# Patient Record
Sex: Male | Born: 1935 | Race: White | Hispanic: No | Marital: Married | Smoking: Former smoker
Health system: Southern US, Community
[De-identification: ages and names within clinical notes are randomized; demographics above are authoritative.]

## PROBLEM LIST (undated history)

## (undated) DIAGNOSIS — I350 Nonrheumatic aortic (valve) stenosis: Secondary | ICD-10-CM

## (undated) DIAGNOSIS — Z87442 Personal history of urinary calculi: Secondary | ICD-10-CM

## (undated) DIAGNOSIS — R319 Hematuria, unspecified: Secondary | ICD-10-CM

## (undated) DIAGNOSIS — I10 Essential (primary) hypertension: Secondary | ICD-10-CM

## (undated) DIAGNOSIS — E785 Hyperlipidemia, unspecified: Secondary | ICD-10-CM

## (undated) DIAGNOSIS — T7840XA Allergy, unspecified, initial encounter: Secondary | ICD-10-CM

## (undated) DIAGNOSIS — R0602 Shortness of breath: Secondary | ICD-10-CM

## (undated) DIAGNOSIS — I480 Paroxysmal atrial fibrillation: Secondary | ICD-10-CM

## (undated) HISTORY — DX: Nonrheumatic aortic (valve) stenosis: I35.0

## (undated) HISTORY — DX: Paroxysmal atrial fibrillation: I48.0

## (undated) HISTORY — DX: Hyperlipidemia, unspecified: E78.5

## (undated) HISTORY — DX: Allergy, unspecified, initial encounter: T78.40XA

## (undated) HISTORY — DX: Shortness of breath: R06.02

## (undated) HISTORY — DX: Essential (primary) hypertension: I10

## (undated) HISTORY — PX: CATARACT EXTRACTION, BILATERAL: SHX1313

## (undated) HISTORY — PX: TONSILLECTOMY: SUR1361

---

## 1998-06-25 ENCOUNTER — Encounter: Payer: Self-pay | Admitting: Cardiology

## 1998-06-25 ENCOUNTER — Ambulatory Visit (HOSPITAL_COMMUNITY): Admission: RE | Admit: 1998-06-25 | Discharge: 1998-06-25 | Payer: Self-pay | Admitting: Cardiology

## 1998-07-31 ENCOUNTER — Ambulatory Visit (HOSPITAL_COMMUNITY): Admission: RE | Admit: 1998-07-31 | Discharge: 1998-07-31 | Payer: Self-pay | Admitting: *Deleted

## 1998-07-31 ENCOUNTER — Encounter: Payer: Self-pay | Admitting: *Deleted

## 1999-04-23 ENCOUNTER — Ambulatory Visit (HOSPITAL_COMMUNITY): Admission: RE | Admit: 1999-04-23 | Discharge: 1999-04-23 | Payer: Self-pay | Admitting: Cardiology

## 1999-04-23 ENCOUNTER — Encounter: Payer: Self-pay | Admitting: Cardiology

## 2001-08-31 ENCOUNTER — Encounter: Admission: RE | Admit: 2001-08-31 | Discharge: 2001-08-31 | Payer: Self-pay | Admitting: Cardiology

## 2001-08-31 ENCOUNTER — Encounter: Payer: Self-pay | Admitting: Cardiology

## 2002-03-30 ENCOUNTER — Encounter: Admission: RE | Admit: 2002-03-30 | Discharge: 2002-03-30 | Payer: Self-pay | Admitting: Internal Medicine

## 2003-08-12 ENCOUNTER — Ambulatory Visit (HOSPITAL_COMMUNITY): Admission: RE | Admit: 2003-08-12 | Discharge: 2003-08-12 | Payer: Self-pay | Admitting: *Deleted

## 2007-11-03 ENCOUNTER — Encounter: Admission: RE | Admit: 2007-11-03 | Discharge: 2007-11-03 | Payer: Self-pay | Admitting: Cardiology

## 2008-03-25 ENCOUNTER — Ambulatory Visit (HOSPITAL_COMMUNITY): Admission: RE | Admit: 2008-03-25 | Discharge: 2008-03-25 | Payer: Self-pay | Admitting: Orthopedic Surgery

## 2008-03-25 ENCOUNTER — Ambulatory Visit: Payer: Self-pay | Admitting: Surgery

## 2008-03-25 ENCOUNTER — Encounter (INDEPENDENT_AMBULATORY_CARE_PROVIDER_SITE_OTHER): Payer: Self-pay | Admitting: Orthopedic Surgery

## 2008-08-30 ENCOUNTER — Encounter: Admission: RE | Admit: 2008-08-30 | Discharge: 2008-08-30 | Payer: Self-pay | Admitting: Orthopedic Surgery

## 2009-02-07 ENCOUNTER — Encounter: Admission: RE | Admit: 2009-02-07 | Discharge: 2009-02-07 | Payer: Self-pay

## 2009-02-07 ENCOUNTER — Inpatient Hospital Stay (HOSPITAL_COMMUNITY): Admission: EM | Admit: 2009-02-07 | Discharge: 2009-02-11 | Payer: Self-pay | Admitting: Emergency Medicine

## 2009-02-07 ENCOUNTER — Encounter (INDEPENDENT_AMBULATORY_CARE_PROVIDER_SITE_OTHER): Payer: Self-pay | Admitting: General Surgery

## 2009-02-07 HISTORY — PX: APPENDECTOMY: SHX54

## 2010-01-15 DIAGNOSIS — R0602 Shortness of breath: Secondary | ICD-10-CM

## 2010-01-15 HISTORY — DX: Shortness of breath: R06.02

## 2010-11-08 LAB — BASIC METABOLIC PANEL
BUN: 10 mg/dL (ref 6–23)
BUN: 16 mg/dL (ref 6–23)
CO2: 27 mEq/L (ref 19–32)
Chloride: 101 mEq/L (ref 96–112)
Chloride: 102 mEq/L (ref 96–112)
Chloride: 103 mEq/L (ref 96–112)
Creatinine, Ser: 1.4 mg/dL (ref 0.4–1.5)
Creatinine, Ser: 1.74 mg/dL — ABNORMAL HIGH (ref 0.4–1.5)
GFR calc Af Amer: 45 mL/min — ABNORMAL LOW (ref >60)
GFR calc Af Amer: 50 mL/min — ABNORMAL LOW (ref >60)
GFR calc non Af Amer: 37 mL/min — ABNORMAL LOW (ref >60)
GFR calc non Af Amer: 48 mL/min — ABNORMAL LOW (ref >60)
Glucose, Bld: 127 mg/dL — ABNORMAL HIGH (ref 70–99)
Potassium: 4 mEq/L (ref 3.5–5.1)
Sodium: 136 mEq/L (ref 135–145)

## 2010-11-08 LAB — CBC
HCT: 35.1 % — ABNORMAL LOW (ref 39.0–52.0)
Hemoglobin: 12 g/dL — ABNORMAL LOW (ref 13.0–17.0)
MCHC: 33.3 g/dL (ref 30.0–36.0)
MCV: 98.3 fL (ref 78.0–100.0)
Platelets: 174 10*3/uL (ref 150–400)
RDW: 14.5 % (ref 11.5–15.5)
RDW: 14.5 % (ref 11.5–15.5)
WBC: 11.6 10*3/uL — ABNORMAL HIGH (ref 4.0–10.5)

## 2010-11-08 LAB — COMPREHENSIVE METABOLIC PANEL
ALT: 18 U/L (ref 0–53)
Calcium: 8.8 mg/dL (ref 8.4–10.5)
Creatinine, Ser: 1.7 mg/dL — ABNORMAL HIGH (ref 0.4–1.5)
Glucose, Bld: 98 mg/dL (ref 70–99)
Sodium: 137 mEq/L (ref 135–145)
Total Protein: 6.6 g/dL (ref 6.0–8.3)

## 2010-11-08 LAB — URINE CULTURE: Culture: NO GROWTH

## 2010-11-08 LAB — URINALYSIS, ROUTINE W REFLEX MICROSCOPIC
Glucose, UA: NEGATIVE mg/dL
Hgb urine dipstick: NEGATIVE
Specific Gravity, Urine: 1.015 (ref 1.005–1.030)

## 2010-11-08 LAB — DIFFERENTIAL
Eosinophils Absolute: 0.1 10*3/uL (ref 0.0–0.7)
Lymphocytes Relative: 17 % (ref 12–46)
Lymphs Abs: 1.8 10*3/uL (ref 0.7–4.0)
Monocytes Relative: 9 % (ref 3–12)
Neutro Abs: 8.2 10*3/uL — ABNORMAL HIGH (ref 1.7–7.7)
Neutrophils Relative %: 73 % (ref 43–77)

## 2010-12-15 NOTE — Discharge Summary (Signed)
NAME:  Jeff Barrett, Jeff Barrett NO.:  [id number]   MEDICAL RECORD NO.:  [id number]          PATIENT TYPE:  INP   LOCATION:  5127                         FACILITY:  [facility]   PHYSICIAN:  Anselm Pancoast. Weatherly, M.D.DATE OF BIRTH:  03-Nov-1920   DATE OF ADMISSION:  02/07/2009  DATE OF DISCHARGE:  02/11/2009                               DISCHARGE SUMMARY   ADMITTING PHYSICIAN:  Troy Sine. Dwain Sarna, MD   DISCHARGING PHYSICIAN:  Anselm Pancoast. Zachery Dakins, MD   CHIEF COMPLAINT/REASON FOR ADMISSION:  Jeff Barrett is an 77 year old  male patient presented with a 2-day history of progressive right lower  quadrant abdominal pain now in his left lower quadrant.  The patient has  had associated anorexia without nausea and vomiting, unable to obtain  any relief from the pain, decreased flatus but still able to have bowel  movements.  Son reports that the patient has been complaining of right-  sided back pain for a couple of weeks.  The patient also has a history  of atrial fibrillation but has been maintaining sinus rhythm and is not  on anticoagulation.  On exam, the patient's abdomen was mildly distended  with tenderness and guarding in the right lower quadrant.  His white  count was 11,200, and a CT was indicative of acute appendicitis.  The  patient was admitted by Dr. Dwain Sarna with following diagnosis:  Acute  appendicitis.   HOSPITAL COURSE:  The patient was admitted and taken to the OR where he  underwent a laparoscopic converted to open appendectomy for an acute  perforated appendicitis.  The patient tolerated the procedure well and  was sent back to the general surgical floor to recover.  He had been  started on empiric antibiotic therapy with Invanz preoperatively and was  changed to Zosyn postoperatively.   Over the next several days, the patient was troubled by a mild  postoperative ileus, but by postop day #3, he had been tolerating a  clear liquid diet.  His diet was  advanced to a regular diet, and by  postop day #4, he was tolerating a regular diet.  He was afebrile, vital  signs were stable, his electrolyte panel was normal with a creatinine of  1.74, potassium 3.6.  His right lower quadrant abdominal incision had  interrupted staples with wicks.  The wicks were removed by Dr.  Zachery Dakins, and a dry dressing was replaced and plans were to transition  the patient to oral antibiotics today make sure he tolerated that and at  least Tylenol for pain, Vicodin for more severe pain and discharge later  in the day.   FINAL DISCHARGE DIAGNOSES:  1. Abdominal pain secondary to acute perforated appendicitis.  2. Status post laparoscopic converted to open appendectomy on date of      admission by Dr. Dwain Sarna.  3. History of atrial fibrillation, maintaining sinus rhythm.  4. Dyslipidemia.  5. Hypertension.   DISCHARGE MEDICATIONS:  The patient will resume the following home  medications:  1. Cardizem/diltiazem 180 mg daily.  2. Doxazosin 4 mg daily.  3. Ecotrin 81 mg daily.  4. Furosemide  40 mg daily.  5. Lipitor 20 mg daily.  6. Nexium 40 mg b.i.d.  7. Toprol-XL 50 mg daily.   NEW MEDICATIONS:  1. Amoxicillin 500 mg q.i.d. for 5 days.  2. Vicodin 5/325 one to two tablets every 4 hours as needed for pain,      #20 dispensed with zero refills.  3. Tylenol over-the-counter as needed for pain.   ADDITIONAL INSTRUCTIONS:  Dry dressing over the right lower quadrant  belly wound, change as needed; otherwise, he will return to see Dr.  Dwain Sarna in the office in 1 week for staple removal and wound check.  He needs to call to arrange that appointment.   DIET:  No restrictions as prior to admission.   ACTIVITY:  Increase activity slowly.  May walk up steps.  May shower  starting on Wednesday.  No lifting for 5 weeks greater than 15 pounds.  No driving for 1 week.   ADDITIONAL INSTRUCTIONS:  Call the surgeon if,  A.  Fever greater than or equal to 101.5  degrees Fahrenheit.  B.  New or increased belly pain.  C.  Nausea, vomiting, or diarrhea.  D.  Redness or drainage from your wounds.      Allison L. Rennis Harding, N.P.      Anselm Pancoast. Zachery Dakins, M.D.  Electronically Signed    ALE/MEDQ  D:  02/11/2009  T:  02/11/2009  Job:  [id number]   cc:   Jeff Gosling, MD

## 2010-12-15 NOTE — Op Note (Signed)
NAME:  Jeff Barrett, Jeff Barrett NO.:  [id number]   MEDICAL RECORD NO.:  [id number]          PATIENT TYPE:  INP   LOCATION:  5127                         FACILITY:  [facility]   PHYSICIAN:  Juanetta Gosling, MDDATE OF BIRTH:  Jul 03, 1921   DATE OF PROCEDURE:  02/07/2009  DATE OF DISCHARGE:                               OPERATIVE REPORT   PREOPERATIVE DIAGNOSIS:  Acute appendicitis.   POSTOPERATIVE DIAGNOSIS:  Acute perforated appendicitis.   PROCEDURE:  Laparoscopic converted to open appendectomy.   SURGEON:  Troy Sine. Dwain Sarna, MD   ASSISTANT:  None.   ANESTHESIA:  General.   FINDINGS:  Perforated tip of the appendicitis with abscess cavity  retroperiteoneally   SPECIMENS:  Appendix to Pathology.   ESTIMATED BLOOD LOSS:  Minimal.   COMPLICATIONS:  None.   DRAINS:  None.   DISPOSITION:  To recovery room in stable condition.   SUPERVISING ANESTHESIOLOGIST:  Sheldon Silvan, MD   INDICATIONS:  Jeff Barrett is an 77 year old male with about at least a 2-  day history of right lower quadrant pain.  He also complains of back  pain during this.  This has been worse, and he has no appetite, no  subjective fevers, no nausea or vomiting, and had decrease flatus during  this time.  He presented today just for persistent pain.  His white  blood cell count was mildly elevated at 11.28 and a CT scan that  appeared to show acute appendicitis along with mesenteric stranding  surrounding his appendix.  He and I along with his wife discussed a  laparoscopic possible open appendectomy.   PROCEDURE:  After informed consent was obtained, the patient was first  administered 1 g of intravenous Invanz.  Sequential compression devices  were placed on the lower extremities.  He was then taken to the  operating room and placed under general endotracheal anesthesia without  complication.  Foley catheter was then placed.  His arms were tucked and  appropriately padded.  ChloraPrep was  used to prep his abdomen.  Three  minutes were allowed to pass.  He was then draped in a standard sterile  surgical fashion.  Surgical time-out was then performed.   A 10-mm vertical incision was then made below his umbilicus, and  dissection was carried out down the level of the fascia.  This was  entered sharply, and his peritoneum was entered bluntly.  A 0 Vicryl  pursestring suture was placed in the fascia.  Hasson trocar was then  introduced, and the abdomen was insufflated to 15 mmHg pressure.  Two  further 5-mm ports were placed in the left lower quadrant and right  upper quadrant under direct vision without complication after  infiltration with local anesthetic.  He was then placed in the reverse  Trendelenburg position.  Glassman graspers were then used to identify  the cecum where the base of the appendix was very visible.  This was  dissected free from the surrounding structures and was divided with a  GIA stapler.  I then attempted to follow this appendix where it dove  retroperitoneal and appeared to be  very adherent to the cecum.  I was  really unable to really separate this or confidently tell what plane  that I was in at that point.  I elected, due to my inability to discern  the structures, to then make a right lower quadrant incision overlying  where the appendix was and to perform an open appendectomy.  I made a  right lower quadrant incision in a muscle-splitting fashion, entered the  peritoneum without difficulty.  His appendix appeared to be dilated at  the tip and perforated at the tip, and there was an abscess cavity and a  lot of inflammation at the tip of his appendix in the retroperitoneum.  The blood supply had previously been taken with the harmonic scalpel.  The appendix was then removed with a combination of electrocautery and  blunt dissection.  The appendix was removed in total and passed off the  table as a specimen.  It was very difficult to remove his  appendix due  to the inflammation that was present at especially at the tip of the  appendix.  There were no other areas that appeared in need of drainage  upon a thorough exploration.  The cecum was viable, and the staples at  the base were in good position and hemostatic.  Copious irrigation was  performed.  Hemostasis was observed.  I then closed his peritoneum with  a 0 Vicryl suture after closing my umbilical incision and no further  defect was noted.  The fascia was then closed with a #1 PDS suture.  I  then stapled the laparoscopic wounds.  I then stapled the right lower  quadrant wound and placed Telfa wicks in there to attempt to let this  area drain.  Sterile dressings were placed overlying all of these.  He  tolerated this well, was extubated in the operating room, and was  transferred to the recovery room in stable condition.      Juanetta Gosling, MD  Electronically Signed     MCW/MEDQ  D:  02/07/2009  T:  02/08/2009  Job:  [id number]

## 2010-12-15 NOTE — H&P (Signed)
NAME:  KREED, KAUFFMAN NO.:  [id number]   MEDICAL RECORD NO.:  [id number]          PATIENT TYPE:  INP   LOCATION:  5127                         FACILITY:  [facility]   PHYSICIAN:  Juanetta Gosling, MDDATE OF BIRTH:  [birth year]   DATE OF ADMISSION:  02/07/2009  DATE OF DISCHARGE:                              HISTORY & PHYSICAL   CHIEF COMPLAINT:  Abdominal pain.   HISTORY OF PRESENT ILLNESS:  This is an 77 year old male with about a 2-  day history of worsening right lower quadrant pain, is now developed in  his left lower quadrant as well.  He came to see his physician today due  to the worsening pain.  Pain was acute 2 days ago and has been  persistent.  It is not really aggravated or relieved by anything that he  can identify.  He has had no appetite for the last 2 days, but has had  no nausea or vomiting.  No subjective fevers.  He has had decreased  flatus, but is passing flatus and having bowel movements. His son also  states he has had back pain for the last couple weeks on the right side.   PAST MEDICAL HISTORY:  1. Atrial fibrillation in the past, he has been in normal sinus      rhythm.  2. Hypercholesterolemia.  3. Hypertension.   PAST SURGICAL HISTORY:  Negative.   FAMILY HISTORY:  Noncontributory.   SOCIAL HISTORY:  Nonsmoker and does not drink alcohol.  Lives in  [place] with his wife.   No known drug allergies.   MEDICATIONS:  Cardizem, doxazosin, Toprol XL, Ecotrin, Lipitor, Nexium,  and Lasix.   REVIEW OF SYSTEMS:  He has had a colonoscopy in the last 5 years by Dr.  Sabino Gasser, however, which he reports is negative.  He has no history of  cardiac or pulmonary or liver disease.   PHYSICAL EXAMINATION:  VITAL SIGNS:  98.0, 52, 16, and 107/59.  General:  He is an elderly male in no distress.  NECK:  Supple without adenopathy.  HEART:  Regular rate and rhythm.  LUNGS:  Clear bilaterally.  ABDOMEN:  Mildly distended with a tender right  lower quadrant.  EXTREMITIES:  No edema.   LABORATORY EVALUATION:  White blood cell count of 11.2, hematocrit of  36.2, and platelets of 199.  CT shows acute appendicitis.   IMPRESSION:  Acute appendicitis.   PLAN OF ACTION:  Laparoscopic, possible open appendectomy.  We discussed  the need for appendectomy.  The risks of the operation including open  operation, open wound, postoperative abscess, as well as injury to bowel  or bladder with trocars.  He and his wife and his son were present for  this conversation.  We will proceed tonight after administration of  antibiotics and fluid resuscitation.      Juanetta Gosling, MD  Electronically Signed     MCW/MEDQ  D:  02/07/2009  T:  02/08/2009  Job:  [id number]

## 2010-12-18 NOTE — Op Note (Signed)
NAME:  Jeff Barrett, Jeff Barrett NO.:  [id number]   MEDICAL RECORD NO.:  [id number]                   PATIENT TYPE:  AMB   LOCATION:  ENDO                                 FACILITY:  [facility]   PHYSICIAN:  Georgiana Spinner, M.D.                 DATE OF BIRTH:  1921-05-24   DATE OF PROCEDURE:  08/12/2003  DATE OF DISCHARGE:                                 OPERATIVE REPORT   PROCEDURE:  Colonoscopy.   INDICATIONS:  Colon cancer screening.   ANESTHESIA:  Demerol 10 mg, Versed 1 mg.   DESCRIPTION OF PROCEDURE:  With the patient mildly sedated in the left  lateral decubitus position, the Olympus videoscopic colonoscope was inserted  in the rectum and passed under direct vision into the cecum, identified by  the ileocecal valve and appendiceal orifice, both of which were  photographed.  From this point the colonoscope was slowly withdrawn, taking  circumferential views of the colonic mucosa, stopping only to photograph  diverticulosis in the sigmoid colon until we reached the rectum, which  appeared normal on direct and retroflex view.  The endoscope was  straightened and withdrawn.  The patient's vital signs and pulse oximetry  remained stable.  The patient tolerated the procedure well without apparent  complications.   FINDINGS:  Diverticulosis of the sigmoid colon, otherwise unremarkable exam.   PLAN:  Have the patient follow up with me as needed.                                               Georgiana Spinner, M.D.    GMO/MEDQ  D:  08/12/2003  T:  08/12/2003  Job:  [id number]

## 2010-12-18 NOTE — Op Note (Signed)
NAME:  Jeff Barrett, Jeff Barrett NO.:  [id number]   MEDICAL RECORD NO.:  [id number]                   PATIENT TYPE:  AMB   LOCATION:  ENDO                                 [facility]ACILITY:  [facility]   PHYSICIAN:  Georgiana Spinner, M.D.                 DATE OF BIRTH:  1921/03/23   DATE OF PROCEDURE:  DATE OF DISCHARGE:                                 OPERATIVE REPORT   PROCEDURE:  Upper endoscopy.   INDICATIONS:  Gastroesophageal reflux disease.   ANESTHESIA:  Demerol 50 mg, Versed 4 mg.   DESCRIPTION OF PROCEDURE:  With the patient mildly sedated in the left  lateral decubitus position, the Olympus videoscopic endoscope was inserted  in the mouth and passed under direct vision through the esophagus, which  appeared normal.  There was no evidence of Barrett's, but there was a  question of a stricture.  We entered into the stomach through a hiatal  hernia.  The  fundus, body, antrum, duodenal bulb, and second portion of the  duodenum were visualized and all appeared normal.  From this point, the  endoscope was slowly withdrawn taking circumferential views of the duodenal  mucosa until the endoscope then pulled back into the stomach, placed in  retroflexion and viewed the stomach from below.  The endoscope was then  straightened, withdrawn, taking circumferential views of the remaining  gastric and esophageal mucosa.  The patient's vital signs and pulse oximetry  remained stable.  The patient tolerated the procedure well without apparent  complication.   FINDINGS:  Unremarkable examination other than hiatal hernia.  Proceed to  colonoscopy.                                               Georgiana Spinner, M.D.    GMO/MEDQ  D:  08/12/2003  T:  08/12/2003  Job:  [id number]

## 2012-12-19 ENCOUNTER — Ambulatory Visit: Payer: Self-pay | Admitting: Internal Medicine

## 2012-12-19 ENCOUNTER — Encounter: Payer: Self-pay | Admitting: Physician Assistant

## 2012-12-19 ENCOUNTER — Encounter: Payer: Self-pay | Admitting: Cardiovascular Disease

## 2012-12-19 ENCOUNTER — Ambulatory Visit (INDEPENDENT_AMBULATORY_CARE_PROVIDER_SITE_OTHER): Payer: Medicare Other | Admitting: Physician Assistant

## 2012-12-19 VITALS — BP 100/70 | HR 129 | Ht 67.0 in | Wt 143.3 lb

## 2012-12-19 DIAGNOSIS — E785 Hyperlipidemia, unspecified: Secondary | ICD-10-CM | POA: Insufficient documentation

## 2012-12-19 DIAGNOSIS — I471 Supraventricular tachycardia: Secondary | ICD-10-CM

## 2012-12-19 DIAGNOSIS — I480 Paroxysmal atrial fibrillation: Secondary | ICD-10-CM | POA: Insufficient documentation

## 2012-12-19 DIAGNOSIS — I10 Essential (primary) hypertension: Secondary | ICD-10-CM | POA: Insufficient documentation

## 2012-12-19 DIAGNOSIS — I479 Paroxysmal tachycardia, unspecified: Secondary | ICD-10-CM

## 2012-12-19 DIAGNOSIS — R000 Tachycardia, unspecified: Secondary | ICD-10-CM

## 2012-12-19 DIAGNOSIS — I498 Other specified cardiac arrhythmias: Secondary | ICD-10-CM

## 2012-12-19 MED ORDER — DILTIAZEM HCL ER COATED BEADS 180 MG PO CP24: 180.0000 mg | ORAL_CAPSULE | Freq: Every day | ORAL | Status: DC

## 2012-12-19 NOTE — Assessment & Plan Note (Signed)
Currently on statin

## 2012-12-19 NOTE — Progress Notes (Signed)
Date:  12/19/2012   ID:  [name], DOB 06-12-1921, MRN [id number]  PCP:  Thayer Headings, MD  Primary Cardiologist:  Allyson Sabal     History of Present Illness: Jeff Barrett is a 79 y.o. male Caucasian male with history of hypertension hyperlipidemia, mild aortic stenosis and chronic left lower extremity edema for which he takes Lasix 40 mg daily.  His last 2-D echocardiogram was June 2011 showed ejection fraction greater then 55% and suggestion of impaired LV relaxation. Aortic valve is mildly sclerotic mild aortic regurgitation. Patient presents today after becoming fatigued and tired this past Saturday patient states he worked for part of the day moving some dirt twirled tree stump he is to be and is awake recovered from the weakness. He does report off and on dizziness. Denies nausea, vomiting, fever, chest pain, shortness of breath, orthopnea, PND. He did have some increased the left lower extremity edema.  He increased his Lasix and has noticeable improvement.  EKG in clinic shows atrial tachycardia. During the exam patient's heart rate slowed considerably to sinus rhythm repeat EKG showed normal sinus rhythm with a rate of 62 beats per minute and then patient rate increased back up to approximately 125 beats per minute.     Wt Readings from Last 3 Encounters:  12/19/12 143 lb 4.8 oz (65 kg)     Past Medical History  Diagnosis Date  . Shortness of breath on exertion 01/15/2010    2D Echo - EF >55%; Nuclear Stress Test 01/14/2010 - EKG negative for ischemia, normal myocardial perfusion study  . Hypertension   . Hyperlipidemia   . Mild aortic stenosis     Current Outpatient Prescriptions  Medication Sig Dispense Refill  . aspirin 81 MG EC tablet Take 81 mg by mouth daily. Swallow whole.      Mar[place] Kitchen atorvastatin (LIPITOR) 20 MG tablet Take 20 mg by mouth daily.      . cholecalciferol (VITAMIN D) 1000 UNITS tablet Take 1,000 Units by mouth daily.      Mar[place] Kitchen doxazosin (CARDURA) 4 MG tablet Take  4 mg by mouth at bedtime.      . fish oil-omega-3 fatty acids 1000 MG capsule Take 1 g by mouth daily.      . flunisolide (NASALIDE) 25 MCG/ACT (0.025%) SOLN Inhale 2 sprays into the lungs 2 (two) times daily.      . furosemide (LASIX) 20 MG tablet Take 40 mg by mouth daily.       . Ipratropium-Albuterol (COMBIVENT IN) Inhale into the lungs.      . Multiple Vitamin (MULTIVITAMIN) tablet Take 1 tablet by mouth daily.      [place] Kitchen omeprazole (PRILOSEC) 20 MG capsule Take 20 mg by mouth daily.      [place] Kitchen diltiazem (CARDIZEM CD) 180 MG 24 hr capsule Take 1 capsule (180 mg total) by mouth daily.  30 capsule  3   No current facility-administered medications for this visit.    Allergies:   No Known Allergies  Social History:  The patient  reports that he has quit smoking. He quit smokeless tobacco use about 43 years ago. He reports that  drinks alcohol. He reports that he does not use illicit drugs.   ROS:  Please see the history of present illness.  All other systems reviewed and negative.   PHYSICAL EXAM: VS:  BP 100/70  Pulse 129  Ht 5\' 7"  (1.702 m)  Wt 143 lb 4.8 oz (65 kg)  BMI 22.44 kg/m2 Well nourished, well  developed, in no acute distress HEENT: Pupils are equal round react to light accommodation extraocular movements are intact.  Cardiac: Regular rate and rhythm without murmurs rubs or gallops.Alternatively he had elevated a rate which was regular. Lungs:  clear to auscultation bilaterally, no wheezing, rhonchi or rales Abd: soft, nontender, positive bowel sounds all quadrants, no hepatosplenomegaly Ext: 2+ left lower extremity edema. Trace of right lower extremity edema 2+ radial and dorsalis pedis pulses. Skin: warm and dry Neuro:  Grossly normal  EKG:  Normal sinus rhythm 63 beats per minute on 1 EKG the other showed atrial tachycardia approximately 129 beats per minute.    ASSESSMENT AND PLAN:  Problem List Items Addressed This Visit   Atrial tachycardia, paroxysmal - Primary      While examining the patient is heart rate went from 1 proximal 120s down into the 60s. We repeated EKG showed sinus rhythm and 63 beats per minute and subsequently went right back up into the 120s and atrial tachycardia. I will increase the patient's diltiazem CD to 180 mg daily and order a 30 day event monitor and monitored by [facility].  Followup in 30 days. Also asked the patient to a monitor his blood pressure twice a day and keep record.    Relevant Medications      diltiazem (CARDIZEM CD) 24 hr capsule   Other Relevant Orders      Cardiac event monitor   Essential hypertension     Is borderline hypotensive currently. The patient will monitor his blood pressure twice a day until seen in followup.    Relevant Medications      diltiazem (CARDIZEM CD) 24 hr capsule   Hyperlipidemia     Currently on statin.    Relevant Medications      diltiazem (CARDIZEM CD) 24 hr capsule    Other Visit Diagnoses   Sinus tachycardia        Relevant Orders       EKG 12-Lead

## 2012-12-19 NOTE — Assessment & Plan Note (Signed)
Is borderline hypotensive currently. The patient will monitor his blood pressure twice a day until seen in followup.

## 2012-12-19 NOTE — Patient Instructions (Signed)
Follow up with Dr. Allyson Sabal in one month

## 2012-12-19 NOTE — Assessment & Plan Note (Signed)
While examining the patient is heart rate went from 1 proximal 120s down into the 60s. We repeated EKG showed sinus rhythm and 63 beats per minute and subsequently went right back up into the 120s and atrial tachycardia. I will increase the patient's diltiazem CD to 180 mg daily and order a 30 day event monitor and monitored by [facility].  Followup in 30 days. Also asked the patient to a monitor his blood pressure twice a day and keep record.

## 2012-12-20 ENCOUNTER — Telehealth: Payer: Self-pay | Admitting: *Deleted

## 2012-12-20 DIAGNOSIS — I480 Paroxysmal atrial fibrillation: Secondary | ICD-10-CM

## 2012-12-20 DIAGNOSIS — I350 Nonrheumatic aortic (valve) stenosis: Secondary | ICD-10-CM

## 2012-12-20 NOTE — Telephone Encounter (Signed)
Call pt. And let him know he needs echo this week or first of next week to check heart valve.  Then please have him follow up with Dr. Allyson Sabal next week or either Judie Grieve or myself when Dr. Allyson Sabal is in the office- next week.

## 2012-12-20 NOTE — Telephone Encounter (Signed)
Sarah w/ [facility] stated pt met criteria for new onset Afib w/ HR 130.  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  Faxing report.  Paper chart requested.  Message forwarded to L. Annie Paras, NP for further instructions.

## 2012-12-20 NOTE — Telephone Encounter (Signed)
L. Annie Paras, NP ordered echo.  Message forwarded to [facility] for scheduling.

## 2012-12-20 NOTE — Telephone Encounter (Signed)
Called to check with patient with his PA Flutter. Today on monitor it is obvious a. Flutter, yesterday did appear mor atrial tach/junct tach.  Will discuss with Dr. Allyson Sabal and call the pt back.  He has no symptoms.

## 2012-12-21 ENCOUNTER — Telehealth: Payer: Self-pay | Admitting: *Deleted

## 2012-12-21 NOTE — Telephone Encounter (Signed)
Per Karie Schwalbe Jeff Barrett needs to start Eliquis for atrial fib.  Would like the pt to come in and discuss w/Kristen and start on samples.  Discussed w/pt and he is agreeable to this.  Info given to scheduling

## 2012-12-26 ENCOUNTER — Ambulatory Visit (HOSPITAL_COMMUNITY): Payer: 59

## 2012-12-27 ENCOUNTER — Ambulatory Visit (HOSPITAL_COMMUNITY)
Admission: RE | Admit: 2012-12-27 | Discharge: 2012-12-27 | Disposition: A | Discharge status: Home / Self Care | Payer: Medicare Other | Source: Ambulatory Visit | Attending: Cardiovascular Disease | Admitting: Cardiovascular Disease

## 2012-12-27 DIAGNOSIS — I10 Essential (primary) hypertension: Secondary | ICD-10-CM | POA: Insufficient documentation

## 2012-12-27 DIAGNOSIS — I359 Nonrheumatic aortic valve disorder, unspecified: Secondary | ICD-10-CM

## 2012-12-27 DIAGNOSIS — I480 Paroxysmal atrial fibrillation: Secondary | ICD-10-CM

## 2012-12-27 DIAGNOSIS — I4891 Unspecified atrial fibrillation: Secondary | ICD-10-CM

## 2012-12-27 DIAGNOSIS — I350 Nonrheumatic aortic (valve) stenosis: Secondary | ICD-10-CM

## 2012-12-27 DIAGNOSIS — E785 Hyperlipidemia, unspecified: Secondary | ICD-10-CM | POA: Insufficient documentation

## 2012-12-27 DIAGNOSIS — I379 Nonrheumatic pulmonary valve disorder, unspecified: Secondary | ICD-10-CM | POA: Insufficient documentation

## 2012-12-27 DIAGNOSIS — I059 Rheumatic mitral valve disease, unspecified: Secondary | ICD-10-CM | POA: Insufficient documentation

## 2012-12-27 DIAGNOSIS — I079 Rheumatic tricuspid valve disease, unspecified: Secondary | ICD-10-CM | POA: Insufficient documentation

## 2012-12-27 NOTE — Progress Notes (Signed)
[facility]   2D echo completed 12/27/2012.   Cindy Oris Staffieri, RDCS  

## 2012-12-28 ENCOUNTER — Ambulatory Visit (INDEPENDENT_AMBULATORY_CARE_PROVIDER_SITE_OTHER): Payer: Medicare Other | Admitting: Physician Assistant

## 2012-12-28 ENCOUNTER — Encounter: Payer: Self-pay | Admitting: Physician Assistant

## 2012-12-28 VITALS — BP 116/78 | HR 102 | Ht 69.0 in | Wt 147.6 lb

## 2012-12-28 DIAGNOSIS — I4891 Unspecified atrial fibrillation: Secondary | ICD-10-CM

## 2012-12-28 DIAGNOSIS — I471 Supraventricular tachycardia, unspecified: Secondary | ICD-10-CM

## 2012-12-28 DIAGNOSIS — I480 Paroxysmal atrial fibrillation: Secondary | ICD-10-CM

## 2012-12-28 DIAGNOSIS — I4719 Other supraventricular tachycardia: Secondary | ICD-10-CM

## 2012-12-28 MED ORDER — DILTIAZEM HCL ER COATED BEADS 240 MG PO CP24: 240.0000 mg | ORAL_CAPSULE | Freq: Every day | ORAL | Status: DC

## 2012-12-28 MED ORDER — RIVAROXABAN 15 MG PO TABS: 15.0000 mg | ORAL_TABLET | Freq: Every day | ORAL | Status: DC

## 2012-12-28 NOTE — Assessment & Plan Note (Addendum)
Patient is alternating between sinus rhythm and atrial fibrillation on a frequent basis. He has more consistently in a regular rhythm in the evening and early morning hours most likely with sleep. He doesn't bradycardia in the evening consistent with tachybradycardia syndrome. When he is in sinus rhythm he has an intermittent first-degree AV block. His CHADSVASC score is 3. I will add  Xarelto 15 mg daily dose increase his diltiazem to 240mg  daily.  Followup in one month with Dr. Allyson Sabal.

## 2012-12-28 NOTE — Progress Notes (Signed)
Date:  12/28/2012   ID:  Jeff Barrett, DOB 24-Mar-1921, MRN [id number]  PCP:  Jeff Headings, MD  Primary Cardiologist:  Jeff Barrett     History of Present Illness: Jeff Barrett is a 80 y.o. male Caucasian male with history of hypertension hyperlipidemia, mild aortic stenosis and chronic left lower extremity edema for which he takes Lasix 40 mg daily.  His last 2-D echocardiogram was June 2011 showed ejection fraction greater then 55% and suggestion of impaired LV relaxation. Aortic valve is mildly sclerotic mild aortic regurgitation.   I saw the patient on Dec 19, 2012 at which time he was complaining of being fatigued and tired the past Saturday.  During the exam patient's heart rate slowed considerably to sinus rhythm repeat EKG showed normal sinus rhythm with a rate of 62 beats per minute and then his rate increased back up to approximately 125 beats per minute in what looks like atrial tachycardia. Patient was scheduled for a nuclear stress test which revealed no ischemia and 2-D echocardiogram which revealed an ejection fraction in the range of 55-65% with normal wall motion. He disagreed to diastolic dysfunction the. It was mild moderately calcified without stenosis. Valve area is 1.48 cm mitral valve showed calcified annulus normal thickness leaflets mild regurgitation valve area of 1.51 cm. Radiation was moderately to severely dilated. PA pressure 41 mm of mercury with moderate tricuspid valve regurgitation. Patient was also placed on a CardioNet heart monitor which revealed frequent episodes of sinus bradycardia and rapid nature fibrillation. At its.increase the patient's diltiazem during his last office visit and he presents today in followup. He reports that he is feeling much better and the dizziness is essentially resolved. He only gets a little bit dizzy now if he exerts himself by going up a set of stairs fairly quickly. He otherwise denies nausea, vomiting, fever, chest pain, shortness of  breath, dizziness. Does maintain a small amount of lower extremity edema likely related to agree to diastolic dysfunction.   Wt Readings from Last 3 Encounters:  12/28/12 147 lb 9.6 oz (66.951 kg)  12/19/12 143 lb 4.8 oz (65 kg)     Past Medical History  Diagnosis Date  . Shortness of breath on exertion 01/15/2010    2D Echo - EF >55%; Nuclear Stress Test 01/14/2010 - EKG negative for ischemia, normal myocardial perfusion study  . Hypertension   . Hyperlipidemia   . Mild aortic stenosis     Current Outpatient Prescriptions  Medication Sig Dispense Refill  . aspirin 81 MG EC tablet Take 81 mg by mouth daily. Swallow whole.      Marland Kitchen atorvastatin (LIPITOR) 20 MG tablet Take 20 mg by mouth daily.      . cholecalciferol (VITAMIN D) 1000 UNITS tablet Take 1,000 Units by mouth daily.      Marland Kitchen doxazosin (CARDURA) 4 MG tablet Take 4 mg by mouth at bedtime.      . fish oil-omega-3 fatty acids 1000 MG capsule Take 1 g by mouth daily.      . flunisolide (NASALIDE) 25 MCG/ACT (0.025%) SOLN Inhale 2 sprays into the lungs 2 (two) times daily.      . furosemide (LASIX) 20 MG tablet Take 40 mg by mouth daily.       . Ipratropium-Albuterol (COMBIVENT IN) Inhale into the lungs.      . Multiple Vitamin (MULTIVITAMIN) tablet Take 1 tablet by mouth daily.      Marland Kitchen omeprazole (PRILOSEC) 20 MG capsule Take 20 mg by mouth  daily.      . diltiazem (CARDIZEM CD) 240 MG 24 hr capsule Take 1 capsule (240 mg total) by mouth daily.  90 capsule  3   No current facility-administered medications for this visit.    Allergies:   No Known Allergies  Social History:  The patient  reports that he has quit smoking. He quit smokeless tobacco use about 43 years ago. He reports that  drinks alcohol. He reports that he does not use illicit drugs.   ROS:  Please see the history of present illness.  All other systems reviewed and negative.   PHYSICAL EXAM: VS:  BP 116/78  Pulse 102  Ht 5\' 9"  (1.753 m)  Wt 147 lb 9.6 oz (66.951  kg)  BMI 21.79 kg/m2 Well nourished, well developed, in no acute distress HEENT: Pupils are equal round react to light accommodation extraocular movements are intact.  Cardiac: Regular rate and rhythm without murmurs rubs or gallops.Alternatively he had elevated rate which was regular. Lungs:  clear to auscultation bilaterally, no wheezing, rhonchi or rales Ext: 2+ left lower extremity edema. Trace of right lower extremity edema 2+ radial and dorsalis pedis pulses. Skin: warm and dry Neuro:  Grossly normal  EKG:  Normal sinus rhythm 63 beats per minute on 1 EKG the other showed atrial tachycardia approximately 129 beats per minute.    2-D echocardiogram 12/27/2012 Study Conclusions  - Left ventricle: The cavity size was normal. Wall thickness was normal. Systolic function was normal. The estimated ejection fraction was in the range of 55% to 65%. Wall motion was normal; there were no regional wall motion abnormalities. Features are consistent with a pseudonormal left ventricular filling pattern, with concomitant abnormal relaxation and increased filling pressure (grade 2 diastolic dysfunction). Doppler parameters are consistent with elevated mean left atrial filling pressure. - Aortic valve: Mild to moderate focal calcification and sclerosis without stenosis involving all 3 cusps at the commisures, but mostly the right coronary cusp -- with calcification in the body of the cusp. Transvalvular velocity was minimally increased. There was very mild stenosis. Probably Mild regurgitation. Valve area: 1.33cm^2(VTI). Valve area: 1.48cm^2 (Vmax). - Mitral valve: Calcified annulus. Normal thickness leaflets . Mild regurgitation. Valve area by continuity equation (using LVOT flow): 1.51cm^2. - Left atrium: The atrium was moderately dilated. - Right ventricle: The cavity size was upper normal to mildly dilated. Wall thickness was normal. - Right atrium: The atrium was moderately to  severely dilated. - Atrial septum: No defect or patent foramen ovale was identified. - Tricuspid valve: Moderate regurgitation.  ASSESSMENT AND PLAN:  Problem List Items Addressed This Visit   Paroxysmal atrial fibrillation     Patient is alternating between sinus rhythm and atrial fibrillation on a frequent basis. He has more consistently in a regular rhythm in the evening and early morning hours most likely with sleep. He doesn't bradycardia in the evening consistent with tachybradycardia syndrome. When he is in sinus rhythm he has an intermittent first-degree AV block. His CHADSVASC score is 3. I will add  Xarelto 15 mg daily dose increase his diltiazem to 240mg  daily.  Followup in one month with Dr. Allyson Barrett.    Relevant Medications      diltiazem (CARDIZEM CD) 24 hr capsule    Other Visit Diagnoses   Paroxysmal atrial tachycardia    -  Primary    Relevant Medications       diltiazem (CARDIZEM CD) 24 hr capsule    Other Relevant Orders  EKG 12-Lead

## 2013-01-10 ENCOUNTER — Ambulatory Visit: Payer: 59 | Admitting: Physician Assistant

## 2013-01-18 ENCOUNTER — Ambulatory Visit (INDEPENDENT_AMBULATORY_CARE_PROVIDER_SITE_OTHER): Payer: Medicare Other | Admitting: Cardiovascular Disease

## 2013-01-18 ENCOUNTER — Encounter: Payer: Self-pay | Admitting: Cardiovascular Disease

## 2013-01-18 VITALS — BP 100/50 | HR 63 | Ht 67.5 in | Wt 144.5 lb

## 2013-01-18 DIAGNOSIS — I4891 Unspecified atrial fibrillation: Secondary | ICD-10-CM

## 2013-01-18 DIAGNOSIS — I480 Paroxysmal atrial fibrillation: Secondary | ICD-10-CM

## 2013-01-18 MED ORDER — RIVAROXABAN 15 MG PO TABS: 15.0000 mg | ORAL_TABLET | Freq: Every day | ORAL | Status: DC

## 2013-01-18 NOTE — Progress Notes (Signed)
01/18/2013 Jeff Barrett   24-Jul-1921  [id number]  Primary Physician Jeff Headings, MD Primary Cardiologist: Jeff Gess MD Jeff Barrett   HPI:  Jeff Barrett is a 79 y.o. male Caucasian male with history of hypertension hyperlipidemia, mild aortic stenosis and chronic left lower extremity edema for which he takes Lasix 40 mg daily. His last 2-D echocardiogram was June 2011 showed ejection fraction greater then 55% and suggestion of impaired LV relaxation. Aortic valve is mildly sclerotic mild aortic regurgitation.  I saw the patient on Dec 19, 2012 at which time he was complaining of being fatigued and tired the past Saturday. During the exam patient's heart rate slowed considerably to sinus rhythm repeat EKG showed normal sinus rhythm with a rate of 62 beats per minute and then his rate increased back up to approximately 125 beats per minute in what looks like atrial tachycardia. Patient was scheduled for a nuclear stress test which revealed no ischemia and 2-D echocardiogram which revealed an ejection fraction in the range of 55-65% with normal wall motion. He disagreed to diastolic dysfunction the. It was mild moderately calcified without stenosis. Valve area is 1.48 cm mitral valve showed calcified annulus normal thickness leaflets mild regurgitation valve area of 1.51 cm. Radiation was moderately to severely dilated. PA pressure 41 mm of mercury with moderate tricuspid valve regurgitation. Patient was also placed on a CardioNet heart monitor which revealed frequent episodes of sinus bradycardia and rapid nature fibrillation. At its.increase the patient's diltiazem during his last office visit and he presents today in followup. He reports that he is feeling much better and the dizziness is essentially resolved. He only gets a little bit dizzy now if he exerts himself by going up a set of stairs fairly quickly. He otherwise denies nausea, vomiting, fever, chest pain, shortness  of breath, dizziness. Does maintain a small amount of lower extremity edema likely related to agree to diastolic dysfunction. He was seen by Jeff Barrett [facility] recently who began him on diltiazem and 0 out because of runs of atrial fibrillation with an elevated [place] score. He is relatively asymptomatic    Current Outpatient Prescriptions  Medication Sig Dispense Refill  . aspirin 81 MG EC tablet Take 81 mg by mouth daily. Swallow whole.      Marland Kitchen atorvastatin (LIPITOR) 20 MG tablet Take 20 mg by mouth daily.      . cholecalciferol (VITAMIN D) 1000 UNITS tablet Take 1,000 Units by mouth daily.      Marland Kitchen diltiazem (CARDIZEM CD) 240 MG 24 hr capsule Take 1 capsule (240 mg total) by mouth daily.  90 capsule  3  . doxazosin (CARDURA) 4 MG tablet Take 4 mg by mouth at bedtime.      . fish oil-omega-3 fatty acids 1000 MG capsule Take 1 g by mouth daily.      . flunisolide (NASALIDE) 25 MCG/ACT (0.025%) SOLN Inhale 2 sprays into the lungs 2 (two) times daily.      . furosemide (LASIX) 20 MG tablet Take 40 mg by mouth daily.       . Ipratropium-Albuterol (COMBIVENT IN) Inhale into the lungs.      . Multiple Vitamin (MULTIVITAMIN) tablet Take 1 tablet by mouth daily.      Marland Kitchen omeprazole (PRILOSEC) 20 MG capsule Take 20 mg by mouth daily.      . Rivaroxaban (XARELTO) 15 MG TABS tablet Take 1 tablet (15 mg total) by mouth daily.  30 tablet  5   No  current facility-administered medications for this visit.    No Known Allergies  History   Social History  . Marital Status: Married    Spouse Name: N/A    Number of Children: N/A  . Years of Education: N/A   Occupational History  . Not on file.   Social History Main Topics  . Smoking status: Former Games developer  . Smokeless tobacco: Former Neurosurgeon    Quit date: 04/04/1969  . Alcohol Use: Yes     Comment: occasional  . Drug Use: No  . Sexually Active: Not on file   Other Topics Concern  . Not on file   Social History Narrative  . No narrative on file      Review of Systems: General: negative for chills, fever, night sweats or weight changes.  Cardiovascular: negative for chest pain, dyspnea on exertion, edema, orthopnea, palpitations, paroxysmal nocturnal dyspnea or shortness of breath Dermatological: negative for rash Respiratory: negative for cough or wheezing Urologic: negative for hematuria Abdominal: negative for nausea, vomiting, diarrhea, bright red blood per rectum, melena, or hematemesis Neurologic: negative for visual changes, syncope, or dizziness All other systems reviewed and are otherwise negative except as noted above.    Blood pressure 100/50, pulse 63, height 5' 7.5" (1.715 m), weight 144 lb 8 oz (65.545 kg).  General appearance: alert and no distress Neck: no adenopathy, no carotid bruit, no JVD, supple, symmetrical, trachea midline and thyroid not enlarged, symmetric, no tenderness/mass/nodules Lungs: clear to auscultation bilaterally Heart: regular rate and rhythm, S1, S2 normal, no murmur, click, rub or gallop Extremities: extremities normal, atraumatic, no cyanosis or edema  EKG normal sinus rhythm at 63 with occasional PVCs  ASSESSMENT AND PLAN:   Paroxysmal atrial fibrillation Currently in sinus rhythm on diltiazem and Xarelto       Jeff Gess MD [facility], [facility] 01/18/2013 12:52 PM

## 2013-01-18 NOTE — Assessment & Plan Note (Signed)
Currently in sinus rhythm on diltiazem and Xarelto

## 2013-01-18 NOTE — Patient Instructions (Addendum)
Your physician recommends that you schedule a follow-up appointment in: 6 months with PA(bryan)  Your physician recommends that you schedule a follow-up appointment in: 1 year with Dr Allyson Sabal

## 2013-01-22 ENCOUNTER — Ambulatory Visit: Payer: 59 | Admitting: Cardiovascular Disease

## 2013-01-24 ENCOUNTER — Telehealth: Payer: Self-pay | Admitting: *Deleted

## 2013-01-24 NOTE — Telephone Encounter (Signed)
Fax was received from [facility] saying that a pa was needed for xarelto.  i called [facility] and pa was approved.  Form faxed to [facility] denoting the approval

## 2013-04-18 ENCOUNTER — Emergency Department (HOSPITAL_COMMUNITY): Payer: Medicare Other

## 2013-04-18 ENCOUNTER — Encounter (HOSPITAL_COMMUNITY): Payer: Self-pay | Admitting: *Deleted

## 2013-04-18 ENCOUNTER — Inpatient Hospital Stay (HOSPITAL_COMMUNITY)
Admission: EM | Admit: 2013-04-18 | Discharge: 2013-04-24 | DRG: 418 | Disposition: A | Discharge status: Home / Self Care | Payer: Medicare Other | Attending: Family Medicine | Admitting: Family Medicine

## 2013-04-18 DIAGNOSIS — N390 Urinary tract infection, site not specified: Secondary | ICD-10-CM

## 2013-04-18 DIAGNOSIS — Z01810 Encounter for preprocedural cardiovascular examination: Secondary | ICD-10-CM

## 2013-04-18 DIAGNOSIS — I509 Heart failure, unspecified: Secondary | ICD-10-CM | POA: Diagnosis present

## 2013-04-18 DIAGNOSIS — I4891 Unspecified atrial fibrillation: Secondary | ICD-10-CM | POA: Diagnosis present

## 2013-04-18 DIAGNOSIS — I440 Atrioventricular block, first degree: Secondary | ICD-10-CM | POA: Diagnosis present

## 2013-04-18 DIAGNOSIS — I5189 Other ill-defined heart diseases: Secondary | ICD-10-CM

## 2013-04-18 DIAGNOSIS — Z87442 Personal history of urinary calculi: Secondary | ICD-10-CM

## 2013-04-18 DIAGNOSIS — I359 Nonrheumatic aortic valve disorder, unspecified: Secondary | ICD-10-CM | POA: Diagnosis present

## 2013-04-18 DIAGNOSIS — Z87891 Personal history of nicotine dependence: Secondary | ICD-10-CM

## 2013-04-18 DIAGNOSIS — Z7982 Long term (current) use of aspirin: Secondary | ICD-10-CM

## 2013-04-18 DIAGNOSIS — E785 Hyperlipidemia, unspecified: Secondary | ICD-10-CM

## 2013-04-18 DIAGNOSIS — I5030 Unspecified diastolic (congestive) heart failure: Secondary | ICD-10-CM | POA: Diagnosis present

## 2013-04-18 DIAGNOSIS — Y9210 Unspecified residential institution as the place of occurrence of the external cause: Secondary | ICD-10-CM | POA: Diagnosis not present

## 2013-04-18 DIAGNOSIS — R0789 Other chest pain: Secondary | ICD-10-CM | POA: Diagnosis not present

## 2013-04-18 DIAGNOSIS — R1013 Epigastric pain: Secondary | ICD-10-CM

## 2013-04-18 DIAGNOSIS — Z7901 Long term (current) use of anticoagulants: Secondary | ICD-10-CM

## 2013-04-18 DIAGNOSIS — T448X5A Adverse effect of centrally-acting and adrenergic-neuron-blocking agents, initial encounter: Secondary | ICD-10-CM | POA: Diagnosis not present

## 2013-04-18 DIAGNOSIS — K810 Acute cholecystitis: Secondary | ICD-10-CM

## 2013-04-18 DIAGNOSIS — I519 Heart disease, unspecified: Secondary | ICD-10-CM

## 2013-04-18 DIAGNOSIS — R0902 Hypoxemia: Secondary | ICD-10-CM | POA: Diagnosis present

## 2013-04-18 DIAGNOSIS — N2889 Other specified disorders of kidney and ureter: Secondary | ICD-10-CM | POA: Diagnosis present

## 2013-04-18 DIAGNOSIS — I10 Essential (primary) hypertension: Secondary | ICD-10-CM

## 2013-04-18 DIAGNOSIS — K838 Other specified diseases of biliary tract: Secondary | ICD-10-CM | POA: Diagnosis present

## 2013-04-18 DIAGNOSIS — Z79899 Other long term (current) drug therapy: Secondary | ICD-10-CM

## 2013-04-18 DIAGNOSIS — N183 Chronic kidney disease, stage 3 unspecified: Secondary | ICD-10-CM | POA: Diagnosis present

## 2013-04-18 DIAGNOSIS — IMO0001 Reserved for inherently not codable concepts without codable children: Secondary | ICD-10-CM

## 2013-04-18 DIAGNOSIS — I480 Paroxysmal atrial fibrillation: Secondary | ICD-10-CM

## 2013-04-18 DIAGNOSIS — N400 Enlarged prostate without lower urinary tract symptoms: Secondary | ICD-10-CM | POA: Diagnosis present

## 2013-04-18 DIAGNOSIS — I129 Hypertensive chronic kidney disease with stage 1 through stage 4 chronic kidney disease, or unspecified chronic kidney disease: Secondary | ICD-10-CM | POA: Diagnosis present

## 2013-04-18 DIAGNOSIS — R52 Pain, unspecified: Secondary | ICD-10-CM | POA: Diagnosis present

## 2013-04-18 DIAGNOSIS — T46905A Adverse effect of unspecified agents primarily affecting the cardiovascular system, initial encounter: Secondary | ICD-10-CM | POA: Diagnosis not present

## 2013-04-18 DIAGNOSIS — E162 Hypoglycemia, unspecified: Secondary | ICD-10-CM | POA: Diagnosis not present

## 2013-04-18 DIAGNOSIS — K8020 Calculus of gallbladder without cholecystitis without obstruction: Secondary | ICD-10-CM

## 2013-04-18 DIAGNOSIS — I700 Atherosclerosis of aorta: Secondary | ICD-10-CM | POA: Diagnosis present

## 2013-04-18 DIAGNOSIS — K8042 Calculus of bile duct with acute cholecystitis without obstruction: Principal | ICD-10-CM | POA: Diagnosis present

## 2013-04-18 DIAGNOSIS — I9589 Other hypotension: Secondary | ICD-10-CM | POA: Diagnosis not present

## 2013-04-18 LAB — COMPREHENSIVE METABOLIC PANEL
ALT: 450 U/L — ABNORMAL HIGH (ref 0–53)
Albumin: 2.9 g/dL — ABNORMAL LOW (ref 3.5–5.2)
Calcium: 9.1 mg/dL (ref 8.4–10.5)
GFR calc Af Amer: 53 mL/min — ABNORMAL LOW (ref >90)
Glucose, Bld: 150 mg/dL — ABNORMAL HIGH (ref 70–99)
Potassium: 3.9 mEq/L (ref 3.5–5.1)
Sodium: 136 mEq/L (ref 135–145)
Total Protein: 7.1 g/dL (ref 6.0–8.3)

## 2013-04-18 LAB — CBC WITH DIFFERENTIAL/PLATELET
Basophils Relative: 0 % (ref 0–1)
Eosinophils Absolute: 0 10*3/uL (ref 0.0–0.7)
Eosinophils Relative: 0 % (ref 0–5)
Lymphs Abs: 0.7 10*3/uL (ref 0.7–4.0)
MCH: 33.6 pg (ref 26.0–34.0)
MCHC: 35.2 g/dL (ref 30.0–36.0)
MCV: 95.5 fL (ref 78.0–100.0)
Neutrophils Relative %: 84 % — ABNORMAL HIGH (ref 43–77)
Platelets: 191 10*3/uL (ref 150–400)
RBC: 3.3 MIL/uL — ABNORMAL LOW (ref 4.22–5.81)
RDW: 14 % (ref 11.5–15.5)

## 2013-04-18 LAB — URINALYSIS, ROUTINE W REFLEX MICROSCOPIC
Nitrite: NEGATIVE
Specific Gravity, Urine: 1.017 (ref 1.005–1.030)
Urobilinogen, UA: 1 mg/dL (ref 0.0–1.0)
pH: 8 (ref 5.0–8.0)

## 2013-04-18 LAB — TROPONIN I: Troponin I: <0.3 ng/mL (ref <0.30)

## 2013-04-18 LAB — URINE MICROSCOPIC-ADD ON

## 2013-04-18 LAB — LIPASE, BLOOD: Lipase: 36 U/L (ref 11–59)

## 2013-04-18 MED ORDER — ATORVASTATIN CALCIUM 20 MG PO TABS
20.0000 mg | ORAL_TABLET | Freq: Every day | ORAL | Status: DC
  Administered 2013-04-18 – 2013-04-24 (×7): 20 mg via ORAL
  Filled 2013-04-18 (×8): qty 1

## 2013-04-18 MED ORDER — SODIUM CHLORIDE 0.9 % IV SOLN: INTRAVENOUS | Status: AC

## 2013-04-18 MED ORDER — PIPERACILLIN-TAZOBACTAM 3.375 G IVPB 30 MIN
3.3750 g | Freq: Once | INTRAVENOUS | Status: AC
  Administered 2013-04-18: 3.375 g via INTRAVENOUS
  Filled 2013-04-18: qty 50

## 2013-04-18 MED ORDER — DOXAZOSIN MESYLATE 4 MG PO TABS
4.0000 mg | ORAL_TABLET | Freq: Every day | ORAL | Status: DC
  Administered 2013-04-18: 4 mg via ORAL
  Filled 2013-04-18 (×3): qty 1

## 2013-04-18 MED ORDER — METOPROLOL SUCCINATE ER 25 MG PO TB24
25.0000 mg | ORAL_TABLET | Freq: Every morning | ORAL | Status: DC
  Administered 2013-04-18: 25 mg via ORAL
  Filled 2013-04-18 (×3): qty 1

## 2013-04-18 MED ORDER — HEPARIN (PORCINE) IN NACL 100-0.45 UNIT/ML-% IJ SOLN
1000.0000 [IU]/h | INTRAMUSCULAR | Status: DC
  Filled 2013-04-18: qty 250

## 2013-04-18 MED ORDER — MORPHINE SULFATE 2 MG/ML IJ SOLN: 2.0000 mg | INTRAMUSCULAR | Status: DC | PRN

## 2013-04-18 MED ORDER — SODIUM CHLORIDE 0.9 % IV SOLN
INTRAVENOUS | Status: AC
  Administered 2013-04-18: 75 mL/h via INTRAVENOUS

## 2013-04-18 MED ORDER — ONDANSETRON HCL 4 MG/2ML IJ SOLN
4.0000 mg | Freq: Once | INTRAMUSCULAR | Status: AC
  Administered 2013-04-18: 4 mg via INTRAVENOUS
  Filled 2013-04-18: qty 2

## 2013-04-18 MED ORDER — DILTIAZEM HCL ER COATED BEADS 240 MG PO CP24
240.0000 mg | ORAL_CAPSULE | Freq: Every day | ORAL | Status: DC
  Administered 2013-04-18: 240 mg via ORAL
  Filled 2013-04-18 (×3): qty 1

## 2013-04-18 MED ORDER — MORPHINE SULFATE 4 MG/ML IJ SOLN
4.0000 mg | Freq: Once | INTRAMUSCULAR | Status: AC
  Administered 2013-04-18: 4 mg via INTRAVENOUS
  Filled 2013-04-18: qty 1

## 2013-04-18 MED ORDER — ONDANSETRON HCL 4 MG/2ML IJ SOLN: 4.0000 mg | Freq: Three times a day (TID) | INTRAMUSCULAR | Status: DC | PRN

## 2013-04-18 MED ORDER — GI COCKTAIL ~~LOC~~
30.0000 mL | Freq: Once | ORAL | Status: AC
  Administered 2013-04-18: 30 mL via ORAL
  Filled 2013-04-18: qty 30

## 2013-04-18 MED ORDER — CIPROFLOXACIN IN D5W 400 MG/200ML IV SOLN
400.0000 mg | Freq: Two times a day (BID) | INTRAVENOUS | Status: DC
  Administered 2013-04-18 – 2013-04-19 (×2): 400 mg via INTRAVENOUS
  Filled 2013-04-18 (×3): qty 200

## 2013-04-18 MED ORDER — VITAMIN D3 25 MCG (1000 UNIT) PO TABS
1000.0000 [IU] | ORAL_TABLET | Freq: Every day | ORAL | Status: DC
  Administered 2013-04-18 – 2013-04-24 (×7): 1000 [IU] via ORAL
  Filled 2013-04-18 (×8): qty 1

## 2013-04-18 MED ORDER — PANTOPRAZOLE SODIUM 40 MG PO TBEC
40.0000 mg | DELAYED_RELEASE_TABLET | Freq: Every day | ORAL | Status: DC
  Administered 2013-04-18 – 2013-04-24 (×7): 40 mg via ORAL
  Filled 2013-04-18 (×7): qty 1

## 2013-04-18 NOTE — Progress Notes (Signed)
Utilization Review completed.  Alexzandra Bilton RN CM  

## 2013-04-18 NOTE — Consult Note (Signed)
Reason for Consult: Pre op clearance  Requesting Physician: Surgery service  HPI: This is a 79 y.o. male with a past medical history significant for HTN, dyslipidemia, and PAF which was noted this past spring. He had been holding NSR on Diltiazem. His LOV was June 2014. He is on Xarelto. He has no history of CAD or MI. A Myoview done in June 2011 was low risk. Echo in June 2014 showed an EF of 55-65% with grade 2 diastolic dysfunction. 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 He has been doing well from a cardiac standpoint, no chest pain or palpitations. He is admitted now with acute cholecystitis and we are asked to see him in consult.   PMHx:  Past Medical History  Diagnosis Date  . Shortness of breath on exertion 01/15/2010    2D Echo - EF >55%; Nuclear Stress Test 01/14/2010 - EKG negative for ischemia, normal myocardial perfusion study  . Hypertension   . Hyperlipidemia   . Mild aortic stenosis   . Paroxysmal atrial fibrillation     rate controlled on diltiazem and anticoagulated on Xarelto to   Past Surgical History  Procedure Laterality Date  . Cataract extraction, bilateral    . Tonsillectomy    . Eye surgery    . Hernia repair      right side  . Appendectomy      FAMHx: COPD-father   SOCHx:  reports that he has quit smoking. He quit smokeless tobacco use about 44 years ago. He reports that  drinks alcohol. He reports that he does not use illicit drugs. Lives with his wife in their own home.   ALLERGIES: No Known Allergies  ROS: Pertinent items are noted in HPI. See H&P and surgical consult for complete ROS He says he has had prior carotid dopplers-"OK" He has had chronic LE edema Lt > Rt  HOME MEDICATIONS: Prescriptions prior to admission  Medication Sig Dispense Refill  . aspirin 81 MG EC tablet Take 81 mg by mouth daily. Swallow whole.      Marland Kitchen atorvastatin (LIPITOR) 20 MG tablet Take 20 mg by mouth daily.      .  cholecalciferol (VITAMIN D) 1000 UNITS tablet Take 1,000 Units by mouth daily.      Marland Kitchen diltiazem (CARDIZEM CD) 240 MG 24 hr capsule Take 1 capsule (240 mg total) by mouth daily.  90 capsule  3  . doxazosin (CARDURA) 4 MG tablet Take 4 mg by mouth at bedtime.      . fish oil-omega-3 fatty acids 1000 MG capsule Take 1 g by mouth daily.      . flunisolide (NASALIDE) 25 MCG/ACT (0.025%) SOLN Inhale 2 sprays into the lungs 2 (two) times daily.      . furosemide (LASIX) 20 MG tablet Take 20 mg by mouth daily.       . Ipratropium-Albuterol (COMBIVENT RESPIMAT) 20-100 MCG/ACT AERS respimat Inhale 1 puff into the lungs every 6 (six) hours.      . metoprolol succinate (TOPROL-XL) 25 MG 24 hr tablet Take 25 mg by mouth every morning.      . Multiple Vitamin (MULTIVITAMIN) tablet Take 1 tablet by mouth daily.      Marland Kitchen omeprazole (PRILOSEC) 20 MG capsule Take 20 mg by mouth daily.      . Rivaroxaban (XARELTO) 15 MG TABS tablet Take 15 mg by mouth every morning.      . Rivaroxaban (XARELTO) 15 MG TABS tablet Take  1 tablet (15 mg total) by mouth daily.  30 tablet  5    HOSPITAL MEDICATIONS: I have reviewed the patient's current medications.  VITALS: Blood pressure 118/55, pulse 64, temperature 98.2 F (36.8 C), temperature source Oral, resp. rate 16, SpO2 95.00%.  PHYSICAL EXAM: General appearance: alert, cooperative, no distress and thin Neck: no JVD and soft carotid bruits Lungs: clear to auscultation bilaterally and kyphoscoliosis Heart: regular rate and rhythm and 1-2/6 systolic murmur heard throughout the precordium Abdomen: positive bowel sounds Extremities: trace ededma RLE, chronic skin changes bilat Pulses: 2+ and symmetric Skin: cool and dry Neurologic: Grossly normal  LABS: Results for orders placed during the hospital encounter of 04/18/13 (from the past 48 hour(s))  CBC WITH DIFFERENTIAL     Status: Abnormal   Collection Time    04/18/13  9:08 AM      Result Value Range   WBC 9.7   4.0 - 10.5 K/uL   RBC 3.30 (*) 4.22 - 5.81 MIL/uL   Hemoglobin 11.1 (*) 13.0 - 17.0 g/dL   HCT 19.1 (*) 47.8 - 29.5 %   MCV 95.5  78.0 - 100.0 fL   MCH 33.6  26.0 - 34.0 pg   MCHC 35.2  30.0 - 36.0 g/dL   RDW 62.1  30.8 - 65.7 %   Platelets 191  150 - 400 K/uL   Neutrophils Relative % 84 (*) 43 - 77 %   Neutro Abs 8.1 (*) 1.7 - 7.7 K/uL   Lymphocytes Relative 7 (*) 12 - 46 %   Lymphs Abs 0.7  0.7 - 4.0 K/uL   Monocytes Relative 9  3 - 12 %   Monocytes Absolute 0.8  0.1 - 1.0 K/uL   Eosinophils Relative 0  0 - 5 %   Eosinophils Absolute 0.0  0.0 - 0.7 K/uL   Basophils Relative 0  0 - 1 %   Basophils Absolute 0.0  0.0 - 0.1 K/uL  COMPREHENSIVE METABOLIC PANEL     Status: Abnormal   Collection Time    04/18/13  9:08 AM      Result Value Range   Sodium 136  135 - 145 mEq/L   Potassium 3.9  3.5 - 5.1 mEq/L   Chloride 100  96 - 112 mEq/L   CO2 27  19 - 32 mEq/L   Glucose, Bld 150 (*) 70 - 99 mg/dL   BUN 20  6 - 23 mg/dL   Creatinine, Ser 8.46  0.50 - 1.35 mg/dL   Calcium 9.1  8.4 - 96.2 mg/dL   Total Protein 7.1  6.0 - 8.3 g/dL   Albumin 2.9 (*) 3.5 - 5.2 g/dL   AST 9528 (*) 0 - 37 U/L   ALT 450 (*) 0 - 53 U/L   Alkaline Phosphatase 191 (*) 39 - 117 U/L   Total Bilirubin 1.2  0.3 - 1.2 mg/dL   GFR calc non Af Amer 46 (*) >90 mL/min   GFR calc Af Amer 53 (*) >90 mL/min   Comment: (NOTE)     The eGFR has been calculated using the CKD EPI equation.     This calculation has not been validated in all clinical situations.     eGFR's persistently <90 mL/min signify possible Chronic Kidney     Disease.  LIPASE, BLOOD     Status: None   Collection Time    04/18/13  9:08 AM      Result Value Range   Lipase 36  11 -  59 U/L  TROPONIN I     Status: None   Collection Time    04/18/13  9:08 AM      Result Value Range   Troponin I <0.30  <0.30 ng/mL   Comment:            Due to the release kinetics of cTnI,     a negative result within the first hours     of the onset of symptoms  does not rule out     myocardial infarction with certainty.     If myocardial infarction is still suspected,     repeat the test at appropriate intervals.  URINALYSIS, ROUTINE W REFLEX MICROSCOPIC     Status: Abnormal   Collection Time    04/18/13 10:23 AM      Result Value Range   Color, Urine YELLOW  YELLOW   APPearance CLOUDY (*) CLEAR   Specific Gravity, Urine 1.017  1.005 - 1.030   pH 8.0  5.0 - 8.0   Glucose, UA NEGATIVE  NEGATIVE mg/dL   Hgb urine dipstick MODERATE (*) NEGATIVE   Bilirubin Urine NEGATIVE  NEGATIVE   Ketones, ur NEGATIVE  NEGATIVE mg/dL   Protein, ur NEGATIVE  NEGATIVE mg/dL   Urobilinogen, UA 1.0  0.0 - 1.0 mg/dL   Nitrite NEGATIVE  NEGATIVE   Leukocytes, UA TRACE (*) NEGATIVE  URINE MICROSCOPIC-ADD ON     Status: Abnormal   Collection Time    04/18/13 10:23 AM      Result Value Range   Squamous Epithelial / LPF RARE  RARE   WBC, UA 3-6  <3 WBC/hpf   RBC / HPF 11-20  <3 RBC/hpf   Bacteria, UA MANY (*) RARE   Casts HYALINE CASTS (*) NEGATIVE    EKG: NSR  IMAGING: US Abdomen Complete  04/18/2013   *RADIOLOGY REPORT*  Clinical Data:  The small bowel abdominal pain and elevated liver function tests.  COMPLETE ABDOMINAL ULTRASOUND  Comparison:  CT scan of the abdomen dated 02/07/2009  Findings:  Gallbladder:  The gallbladder is quite distended with the thickening of the gallbladderwall and pericholecystic fluid as well as sludge and stones in the gallbladder.  Findings are consistent with acute cholecystitis.  However, the patient has a negative sonographic Murphy's sign.  Common bile duct:  Common bile duct is 7.4 mm in diameter.  There is slight intrahepatic ductal dilatation.  No visible common bile duct stones.  Liver:  Normal slightly dilated intrahepatic ducts.  No focal mass lesions.  IVC:  Normal.  Pancreas:  The pancreas is normal except for dilatation of the pancreatic duct to 3.8 mm.  Spleen:  Normal.  And 7.2 cm in length.  Right Kidney:  10.9 cm in  length.  Multiple renal calculi.  Left Kidney:  Normal.  9.0 cm in length.  Abdominal aorta:  Maximum diameter is approximately 3 cm at a focal bulge in the mid abdominal aorta.  IMPRESSION:  1.  Cholelithiasis with edematous gallbladder wall and pericholecystic fluid consistent with cholecystitis. 2.  Slightly dilated intra and extrahepatic bile ducts and dilated pancreatic duct consistent with distal common bile duct obstruction.  No discrete stone is identified on this exam however. 3.  Multiple right renal calculi. 4.  Focal dilatation of the abdominal aorta to a diameter of 3 cm.   Original Report Authenticated By: Francene Boyers, M.D.    IMPRESSION: Principal Problem:   Cholecystitis, acute Active Problems:   Paroxysmal atrial fibrillation   Chronic  anticoagulation- Xarelto for PAF   Chronic renal insufficiency, stage III (moderate)   Essential hypertension   Hyperlipidemia   Aortic sclerosis   Diastolic dysfunction- grade 2   RECOMMENDATION: Xarelto held.  I will discuss the need for Heparin with MD. He should be on telemetry post op. We will follow peri-op.  Time Spent Directly with Patient: 45 minutes  Abelino Derrick [phone] beeper 04/18/2013, 2:26 PM

## 2013-04-18 NOTE — Consult Note (Signed)
Pt. Seen and examined. Agree with the NP/PA-C note as written.  Pleasant 79 yo male with a history of PAF on Xarelto, mild aortic sclerosis with normal LV function, no anginal symptoms. Non-ischemic NST in 2011. Now with acute cholecystitis.   Impression: 1. Low risk for low to intermediate risk surgery 2. PAF on xarelto  Plan: 1. Hold xarelto at least 48 hours prior to surgery to ensure lowest adverse bleeding risk. No need for heparin bridging. Restart when suitable afterwards. We will be available peri-operatively as needed.  Thanks for consulting [place].  Chrystie Nose, MD, [facility] Attending Cardiologist [facility]

## 2013-04-18 NOTE — Progress Notes (Addendum)
ANTICOAGULATION CONSULT NOTE - Initial Consult  Pharmacy Consult for Heparin Indication: atrial fibrillation, bridge to surgery  No Known Allergies  Patient Measurements:   Weight 65.5 kg, Height 171 cm as of 01/18/13  Vital Signs: Temp: 98.2 F (36.8 C) (09/17 1250) Temp src: Oral (09/17 1250) BP: 118/55 mmHg (09/17 1250) Pulse Rate: 64 (09/17 1250)  Labs:  Recent Labs  04/18/13 0908  HGB 11.1*  HCT 31.5*  PLT 191  CREATININE 1.33  TROPONINI <0.30    The CrCl is unknown because both a height and weight (above a minimum accepted value) are required for this calculation.   Medical History: Past Medical History  Diagnosis Date  . Shortness of breath on exertion 01/15/2010    2D Echo - EF >55%; Nuclear Stress Test 01/14/2010 - EKG negative for ischemia, normal myocardial perfusion study  . Hypertension   . Hyperlipidemia   . Mild aortic stenosis   . Paroxysmal atrial fibrillation     rate controlled on diltiazem and anticoagulated on Xarelto to     Assessment: 89 yoM on Xarelto PTA for atrial fibrillation, presented with abdominal pain diagnosed as probable cholelithiasis/cholecystitis.  Surgery recommending at least 72 hours off of Xarelto before possible surgery in addition to clearance by cardiology.  Last dose of Xarelto was at 10AM on 9/16.  Holding Xarelto and initiating IV Heparin to bridge to surgery.  Hgb 11.1, Platelets 191  CrCl~34 ml/min  Baseline anticoagulation labs anticipated to be elevated secondary to Xarelto - ordered.  No bleeding reported.  Goal of Therapy:  Heparin level 0.3-0.7 units/ml Monitor platelets by anticoagulation protocol: Yes   Plan:  1.  Has been >24 hours since last Xarelto dose, so will begin Heparin infusion now at 1000 units/hr (10 mL/hr).  Requested RN to update patient's weight.  May need to adjust rate based on new weight. 2.  Check heparin level in 8 hours. 3.  Daily CBC and heparin level while on heparin.  Clance Boll 04/18/2013,1:37 PM   Addendum: 04/18/2013 3:13 PM Patient seen by cardiology PA who is recommending to not start heparin for bridge since patient has been in NSR.  Holding heparin.  Please re-consult pharmacy if needed.  Clance Boll, PharmD, BCPS Pager: [phone] 04/18/2013 3:15 PM

## 2013-04-18 NOTE — Progress Notes (Signed)
Patient declines MyChart activation-computer is broke down

## 2013-04-18 NOTE — ED Provider Notes (Signed)
CSN: [id number]     Arrival date & time 04/18/13  4098 History   First MD Initiated Contact with Patient 04/18/13 [phone]     No chief complaint on file.  (Consider location/radiation/quality/duration/timing/severity/associated sxs/prior Treatment) HPI Patient presents with epigastric pain starting at 3 AM this morning waking him from sleep. Patient states the pain is dull and does not radiate. It is associated with nausea but no vomiting. Has had no diarrhea or melena. Denies fevers. Does state he has had some shaking tremors since the onset of the pain. Denies having pain similar to this in the past. Denies recent NSAID use. Has had appendix removed but still reports having gallbladder. Pain is improved since onset this morning. Past Medical History  Diagnosis Date  . Shortness of breath on exertion 01/15/2010    2D Echo - EF >55%; Nuclear Stress Test 01/14/2010 - EKG negative for ischemia, normal myocardial perfusion study  . Hypertension   . Hyperlipidemia   . Mild aortic stenosis   . Paroxysmal atrial fibrillation     rate controlled on diltiazem and anticoagulated on Xarelto to   No past surgical history on file. Family History  Problem Relation Age of Onset  . Lung disease Father    History  Substance Use Topics  . Smoking status: Former Games developer  . Smokeless tobacco: Former Neurosurgeon    Quit date: 04/04/1969  . Alcohol Use: Yes     Comment: occasional    Review of Systems  Constitutional: Negative for fever and chills.  HENT: Negative for neck pain.   Respiratory: Negative for cough and shortness of breath.   Cardiovascular: Negative for chest pain.  Gastrointestinal: Positive for nausea and abdominal pain. Negative for vomiting, diarrhea, blood in stool and abdominal distention.  Genitourinary: Negative for dysuria and flank pain.  Musculoskeletal: Negative for myalgias and back pain.  Skin: Negative for rash and wound.  Neurological: Positive for tremors. Negative for  dizziness, seizures, syncope, weakness, numbness and headaches.  All other systems reviewed and are negative.    Allergies  Review of patient's allergies indicates no known allergies.  Home Medications   Current Outpatient Rx  Name  Route  Sig  Dispense  Refill  . aspirin 81 MG EC tablet   Oral   Take 81 mg by mouth daily. Swallow whole.         Marland Kitchen atorvastatin (LIPITOR) 20 MG tablet   Oral   Take 20 mg by mouth daily.         . cholecalciferol (VITAMIN D) 1000 UNITS tablet   Oral   Take 1,000 Units by mouth daily.         Marland Kitchen diltiazem (CARDIZEM CD) 240 MG 24 hr capsule   Oral   Take 1 capsule (240 mg total) by mouth daily.   90 capsule   3   . doxazosin (CARDURA) 4 MG tablet   Oral   Take 4 mg by mouth at bedtime.         . fish oil-omega-3 fatty acids 1000 MG capsule   Oral   Take 1 g by mouth daily.         . flunisolide (NASALIDE) 25 MCG/ACT (0.025%) SOLN   Inhalation   Inhale 2 sprays into the lungs 2 (two) times daily.         . furosemide (LASIX) 20 MG tablet   Oral   Take 40 mg by mouth daily.          Marland Kitchen  Ipratropium-Albuterol (COMBIVENT IN)   Inhalation   Inhale into the lungs.         . Multiple Vitamin (MULTIVITAMIN) tablet   Oral   Take 1 tablet by mouth daily.         Marland Kitchen omeprazole (PRILOSEC) 20 MG capsule   Oral   Take 20 mg by mouth daily.         . Rivaroxaban (XARELTO) 15 MG TABS tablet   Oral   Take 1 tablet (15 mg total) by mouth daily.   30 tablet   5    BP 129/58  Pulse 75  Temp(Src) 98.6 F (37 C) (Oral)  Resp 20  SpO2 100% Physical Exam  Nursing note and vitals reviewed. Constitutional: He is oriented to person, place, and time. He appears well-developed and well-nourished. No distress.  HENT:  Head: Normocephalic and atraumatic.  Mouth/Throat: Oropharynx is clear and moist.  Eyes: EOM are normal. Pupils are equal, round, and reactive to light.  Neck: Normal range of motion. Neck supple.   Cardiovascular: Normal rate and regular rhythm.   Pulmonary/Chest: Effort normal and breath sounds normal. No respiratory distress. He has no wheezes. He has no rales.  Abdominal: Soft. Bowel sounds are normal. He exhibits no distension and no mass. There is tenderness (Tenderness to palpation in the epigastrium. There are no pulsatile masses.). There is no rebound and no guarding.  Musculoskeletal: Normal range of motion. He exhibits edema (2+ edema bilaterally). He exhibits no tenderness.  Neurological: He is alert and oriented to person, place, and time.  Moves all extremities without deficit. Sensation grossly intact.  Skin: Skin is warm and dry. No rash noted. No erythema.  Psychiatric: He has a normal mood and affect. His behavior is normal.    ED Course  Procedures (including critical care time) Labs Review Labs Reviewed  CBC WITH DIFFERENTIAL  COMPREHENSIVE METABOLIC PANEL  LIPASE, BLOOD  URINALYSIS, ROUTINE W REFLEX MICROSCOPIC  TROPONIN I   Imaging Review No results found.  Date: 04/18/2013  Rate: 72  Rhythm: normal sinus rhythm  QRS Axis: normal  Intervals: normal  ST/T Wave abnormalities: nonspecific T wave changes  Conduction Disutrbances:none  Narrative Interpretation:   Old EKG Reviewed: unchanged Poor quality EKG with wavering baseline and artifact.  MDM  Patient with epigastric pain starting this morning while lying down. Patient has history of hiatal hernia. Associated nausea. Symptoms improved at this point. Suspect patient may have gastritis versus esophageal reflux. We'll screen for gallbladder, hepatic, pancreatic, cardiac disease. I will go ahead and treat with a GI cocktail and reassess after laboratory tests have been resulted.  Discussed with general surgery PA. Asked to have patient admitted to the medicine service. Will see the patient in consult.  Discuss admission with [facility] hospitalist. Will see the patient in the ED. Patient's vital signs  remained stable throughout stay.  Loren Racer, MD 04/18/13 1200

## 2013-04-18 NOTE — Consult Note (Signed)
Reason for Consult: cholecystitis Referring Physician: Loren Racer, MD CARDIOLOGY:  Jeff Barrett is an 79 y.o. male.  HPI: Patient presents with epigastric pain starting at 3 AM this morning waking him from sleep. Patient states the pain is dull and does not radiate. It is associated with nausea but no vomiting. Has had no diarrhea or melena. Denies fevers. Does state he has had some shaking tremors since the onset of the pain. He has had this pain before, but never persisted this long.  Pain woke him up and continued till here.  He had nausea this AM, but was not able to vomit. Denies recent NSAID use. Has had appendix removed (required open procedure; Dr. Dwain Sarna about 3 years ago.). Pain is improved since onset this morning. He is pretty comfortable now.  Past Medical History  Diagnosis Date  . Shortness of breath on exertion 01/15/2010    2D Echo - EF >55%; Nuclear Stress Test 01/14/2010 - EKG negative for ischemia, normal myocardial perfusion study   Chronic lower leg edema    Hx of tobacco use 20+ years; none since 1970   . Hypertension   . Hyperlipidemia   . Mild aortic stenosis   . Paroxysmal atrial fibrillation     rate controlled on diltiazem and anticoagulated on Xarelto to    History reviewed. No pertinent past surgical history.  Family History  Problem Relation Age of Onset   Died at age 53; no health issues prior. Mother    1 sister deceased with Mental health issues Sister   . Lung disease FatherPatient presents with epigastric pain starting at 3 AM this morning waking him from sleep. Patient states the pain is dull and does not radiate. It is associated with nausea but no vomiting. Has had no diarrhea or melena. Denies fevers. Does state he has had some shaking tremors since the onset of the pain. Denies having pain similar to this in the past. Denies recent NSAID use. Has had appendix removed but still reports having gallbladder. Pain is improved since  onset this morning.      Social History:  reports that he has quit smoking. He quit smokeless tobacco use about 44 years ago. He reports that  drinks alcohol. He reports that he does not use illicit drugs. 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 Authorizing Provider  aspirin 81 MG EC tablet Take 81 mg by mouth daily. Swallow whole.   Yes Historical Provider, MD  atorvastatin (LIPITOR) 20 MG tablet Take 20 mg by mouth daily.   Yes Historical Provider, MD  cholecalciferol (VITAMIN D) 1000 UNITS tablet Take 1,000 Units by mouth daily.   Yes Historical Provider, MD  diltiazem (CARDIZEM CD) 240 MG 24 hr capsule Take 1 capsule (240 mg total) by mouth daily. 12/28/12  Yes Wilburt Finlay, PA-C  doxazosin (CARDURA) 4 MG tablet Take 4 mg by mouth at bedtime.   Yes Historical Provider, MD  fish oil-omega-3 fatty acids 1000 MG capsule Take 1 g by mouth daily.   Yes Historical Provider, MD  flunisolide (NASALIDE) 25 MCG/ACT (0.025%) SOLN Inhale 2 sprays into the lungs 2 (two) times daily.   Yes Historical Provider, MD  furosemide (LASIX) 20 MG tablet Take 20 mg by mouth daily.  Yes Historical Provider, MD  Ipratropium-Albuterol (COMBIVENT RESPIMAT) 20-100 MCG/ACT AERS respimat Inhale 1 puff into the lungs every 6 (six) hours.   Yes Historical Provider, MD  metoprolol succinate (TOPROL-XL) 25 MG 24 hr tablet Take 25 mg by mouth every morning.   Yes Historical Provider, MD  Multiple Vitamin (MULTIVITAMIN) tablet Take 1 tablet by mouth daily.   Yes Historical Provider, MD  omeprazole (PRILOSEC) 20 MG capsule Take 20 mg by mouth daily.   Yes Historical Provider, MD  Rivaroxaban (XARELTO) 15 MG TABS tablet Take 15 mg by mouth every morning.   Yes Historical Provider, MD  Rivaroxaban (XARELTO) 15 MG TABS  tablet Take 1 tablet (15 mg total) by mouth daily. 01/18/13   Runell Gess, MD    Scheduled:  Continuous: . sodium chloride    . piperacillin-tazobactam     WUJ:[id number] (ZOFRAN) IV Anti-infectives   Start     Dose/Rate Route Frequency Ordered Stop   04/18/13 1145  piperacillin-tazobactam (ZOSYN) IVPB 3.375 g     3.375 g 100 mL/hr over 30 Minutes Intravenous  Once 04/18/13 1141        Results for orders placed during the hospital encounter of 04/18/13 (from the past 48 hour(s))  CBC WITH DIFFERENTIAL     Status: Abnormal   Collection Time    04/18/13  9:08 AM      Result Value Range   WBC 9.7  4.0 - 10.5 K/uL   RBC 3.30 (*) 4.22 - 5.81 MIL/uL   Hemoglobin 11.1 (*) 13.0 - 17.0 g/dL   HCT 62.1 (*) 30.8 - 65.7 %   MCV 95.5  78.0 - 100.0 fL   MCH 33.6  26.0 - 34.0 pg   MCHC 35.2  30.0 - 36.0 g/dL   RDW 84.6  96.2 - 95.2 %   Platelets 191  150 - 400 K/uL   Neutrophils Relative % 84 (*) 43 - 77 %   Neutro Abs 8.1 (*) 1.7 - 7.7 K/uL   Lymphocytes Relative 7 (*) 12 - 46 %   Lymphs Abs 0.7  0.7 - 4.0 K/uL   Monocytes Relative 9  3 - 12 %   Monocytes Absolute 0.8  0.1 - 1.0 K/uL   Eosinophils Relative 0  0 - 5 %   Eosinophils Absolute 0.0  0.0 - 0.7 K/uL   Basophils Relative 0  0 - 1 %   Basophils Absolute 0.0  0.0 - 0.1 K/uL  COMPREHENSIVE METABOLIC PANEL     Status: Abnormal   Collection Time    04/18/13  9:08 AM      Result Value Range   Sodium 136  135 - 145 mEq/L   Potassium 3.9  3.5 - 5.1 mEq/L   Chloride 100  96 - 112 mEq/L   CO2 27  19 - 32 mEq/L   Glucose, Bld 150 (*) 70 - 99 mg/dL   BUN 20  6 - 23 mg/dL   Creatinine, Ser 8.41  0.50 - 1.35 mg/dL   Calcium 9.1  8.4 - 32.4 mg/dL   Total Protein 7.1  6.0 - 8.3 g/dL   Albumin 2.9 (*) 3.5 - 5.2 g/dL   AST 4010 (*) 0 - 37 U/L   ALT 450 (*) 0 - 53 U/L   Alkaline Phosphatase 191 (*) 39 - 117 U/L   Total Bilirubin 1.2  0.3 - 1.2 mg/dL   GFR calc non Af Amer 46 (*) >90 mL/min   GFR calc Af Amer 53 (*) >  90 mL/min    Comment: (NOTE)     The eGFR has been calculated using the CKD EPI equation.     This calculation has not been validated in all clinical situations.     eGFR's persistently <90 mL/min signify possible Chronic Kidney     Disease.  LIPASE, BLOOD     Status: None   Collection Time    04/18/13  9:08 AM      Result Value Range   Lipase 36  11 - 59 U/L  TROPONIN I     Status: None   Collection Time    04/18/13  9:08 AM      Result Value Range   Troponin I <0.30  <0.30 ng/mL   Comment:            Due to the release kinetics of cTnI,     a negative result within the first hours     of the onset of symptoms does not rule out     myocardial infarction with certainty.     If myocardial infarction is still suspected,     repeat the test at appropriate intervals.  URINALYSIS, ROUTINE W REFLEX MICROSCOPIC     Status: Abnormal   Collection Time    04/18/13 10:23 AM      Result Value Range   Color, Urine YELLOW  YELLOW   APPearance CLOUDY (*) CLEAR   Specific Gravity, Urine 1.017  1.005 - 1.030   pH 8.0  5.0 - 8.0   Glucose, UA NEGATIVE  NEGATIVE mg/dL   Hgb urine dipstick MODERATE (*) NEGATIVE   Bilirubin Urine NEGATIVE  NEGATIVE   Ketones, ur NEGATIVE  NEGATIVE mg/dL   Protein, ur NEGATIVE  NEGATIVE mg/dL   Urobilinogen, UA 1.0  0.0 - 1.0 mg/dL   Nitrite NEGATIVE  NEGATIVE   Leukocytes, UA TRACE (*) NEGATIVE  URINE MICROSCOPIC-ADD ON     Status: Abnormal   Collection Time    04/18/13 10:23 AM      Result Value Range   Squamous Epithelial / LPF RARE  RARE   WBC, UA 3-6  <3 WBC/hpf   RBC / HPF 11-20  <3 RBC/hpf   Bacteria, UA MANY (*) RARE   Casts HYALINE CASTS (*) NEGATIVE    US Abdomen Complete  04/18/2013   *RADIOLOGY REPORT*  Clinical Data:  The small bowel abdominal pain and elevated liver function tests.  COMPLETE ABDOMINAL ULTRASOUND  Comparison:  CT scan of the abdomen dated 02/07/2009  Findings:  Gallbladder:  The gallbladder is quite distended with the thickening of the  gallbladderwall and pericholecystic fluid as well as sludge and stones in the gallbladder.  Findings are consistent with acute cholecystitis.  However, the patient has a negative sonographic Murphy's sign.  Common bile duct:  Common bile duct is 7.4 mm in diameter.  There is slight intrahepatic ductal dilatation.  No visible common bile duct stones.  Liver:  Normal slightly dilated intrahepatic ducts.  No focal mass lesions.  IVC:  Normal.  Pancreas:  The pancreas is normal except for dilatation of the pancreatic duct to 3.8 mm.  Spleen:  Normal.  And 7.2 cm in length.  Right Kidney:  10.9 cm in length.  Multiple renal calculi.  Left Kidney:  Normal.  9.0 cm in length.  Abdominal aorta:  Maximum diameter is approximately 3 cm at a focal bulge in the mid abdominal aorta.  IMPRESSION:  1.  Cholelithiasis with edematous gallbladder wall and pericholecystic fluid  consistent with cholecystitis. 2.  Slightly dilated intra and extrahepatic bile ducts and dilated pancreatic duct consistent with distal common bile duct obstruction.  No discrete stone is identified on this exam however. 3.  Multiple right renal calculi. 4.  Focal dilatation of the abdominal aorta to a diameter of 3 cm.   Original Report Authenticated By: Francene Boyers, M.D.    Review of Systems  Constitutional: Positive for weight loss (says he's gone from a 36 waist to 34, he's not sure what time period.). Negative for fever, chills, malaise/fatigue and diaphoresis.  HENT: Positive for neck pain.   Eyes: Negative.   Respiratory: Positive for shortness of breath (Occasional DOE) and wheezing (occasional , better with inhaler.). Negative for cough, hemoptysis and sputum production.   Cardiovascular: Positive for chest pain (occasional, associated with activity), palpitations (he can feel heart beat fast, not sure it's associated with anything.) and leg swelling (mild, he says they up his BPH medicine because of this.). Negative for orthopnea,  claudication and PND.  Gastrointestinal: Positive for heartburn (he has Prilosec listed as medicine.  He's not sure about why, says he doesn't normally have it.) and nausea (This AM after he got up with pain.). Negative for vomiting, abdominal pain (sharpe mid epigastric pain.), diarrhea, constipation, blood in stool and melena.  Genitourinary: Negative.   Musculoskeletal: Positive for back pain.       Complaining of pain left posterior hip, right now.   Skin: Negative.   Neurological: Positive for dizziness (he has some occasions of dizziness standing  1-2 times per week). Negative for tingling, tremors, sensory change, speech change, focal weakness, seizures, loss of consciousness and weakness.  Endo/Heme/Allergies: Negative for polydipsia. Bruises/bleeds easily (On xarelto since 12/28/12).  Psychiatric/Behavioral: Negative.    Blood pressure 129/58, pulse 75, temperature 98.6 F (37 C), temperature source Oral, resp. rate 20, SpO2 100.00%. Physical Exam  Constitutional: He is oriented to person, place, and time. No distress.  Thin elderly WM NAD, comfortable with no pain currently He looks like he's had some wt. Loss, and he notes waist line is smaller.  HENT:  Head: Normocephalic and atraumatic.  Nose: Nose normal.  Eyes: Conjunctivae and EOM are normal. Pupils are equal, round, and reactive to light. Right eye exhibits no discharge. Left eye exhibits no discharge. No scleral icterus.  Neck: Normal range of motion. Neck supple. No JVD present. No tracheal deviation present. No thyromegaly present.  Cardiovascular: Normal rate and regular rhythm.   Murmur (2/6 systolic mumur I hear it over aortic and apex) heard. Respiratory: Effort normal and breath sounds normal. No respiratory distress. He has no wheezes. He has no rales. He exhibits no tenderness.  GI: Soft. Bowel sounds are normal. He exhibits no distension and no mass. There is no tenderness. There is no rebound and no guarding.   Points to mid epigastric area as source of pain, that is currently resolved  Musculoskeletal: He exhibits edema (+ 1 edema both lower legs).  Lymphadenopathy:    He has no cervical adenopathy.  Neurological: He is alert and oriented to person, place, and time. No cranial nerve deficit.  Skin: Skin is warm and dry. No rash noted. He is not diaphoretic. No erythema. No pallor.  Lower leg venous stasis changes.  Psychiatric: He has a normal mood and affect. His behavior is normal. Judgment and thought content normal.    Assessment/Plan: 1.  Abdominal pain, some nausea with probable cholelithiasis/cholecystitis 2.  Probable UTI 3.   Hx  of mild Aortic Stenosis 4.  PAF ON Xarelto 5. Hx of Nephrolithiasis 6.  Hx of tobacco use 7.  Hyperlipidemia 8. Dizziness standing 1-2 time per week on  Xarelto,  BB and Cardizem.  Last dose of Xarelto about  6PM yesterday. 9.  Possible UTI    Plan:  He needs to be seen and cardiac clearance, transition off Xarelto for surgery.  He required open appendectomy so he may require open procedure here.   If LFT's do not improve he may need GI to see for possible ERCP. I would keep him on antibiotics for now  he may also be placed on clear liquids for now. He needs at least 72 hours before possible surgery on Xarelto. Will await medical and Cardiology clearance before proceeding. I have called cardiology, he has labs and I have ordered Cipro to cover GB and possible UTI.  He had one dose of Zosyn in the ER.   Jeff Barrett 04/18/2013, 12:26 PM

## 2013-04-18 NOTE — ED Notes (Signed)
Pt states woke up at 3 am w/ mid abdominal pain, states it's an aching/dull pain, also states started to have neck pain and nausea, denies vomiting or diarrhea.

## 2013-04-18 NOTE — H&P (Signed)
[facility] History and Physical  Jeff Barrett ZOX:[id number] DOB: [birth year] DOA: 04/18/2013  Referring physician: Ranae Palms PCP: Thayer Headings, MD  Specialists: [facility]    [facility]    Chief Complaint: Cholecystitis  HPI: Jeff Barrett is a 79 y.o. ? former [employer] teacher , known h/o hypertension, PAF c Elevated CHADS2Vasc2 score on Xarelto, s/p Cardionet for Tachy-brady hyperlipidemia, mild aortic stenosis and chronic left lower extremity edema, with h/o Appendectomy 02/07/2009 by Dr. Dwain Sarna who home this morning abdominal pain starting at around 3 AM. He states it was noted first and is as happened He had an episode of this about 3 weeks ago and thought this was gas or indigestion. The pain was 5/10 in intensity and was constant and it was worsened by moving around. He did not try any pain medications for this.  he states because of the severity of the pain he thought it would be best to come to the emergency room for evaluation of the same. He did have associated nausea but did not have any specific vomiting and could not throw up. He denies any dark stool or tarry stool recently and had a normal regular bowel movement yesterday.  At baseline he is highly functional and able to walk 3 city blocks.  He has steps at home and can climb them up and down without difficulty but does walk them slower than he used to, probably because of his underlying CHF. He is not a current smoker and quit smoking in 1970. He lives with his wife and his children are involved in his care   Emergency room evaluation revealed alkaline phosphatase of 191 of mental 0.9 AST 1009/ALT 450   hemoglobin 11.1 with neutrophilia Urinalysis showed hyaline casts with many bacteria Ultrasound abdomen pelvis showed cholelithiasis with a gallbladder wall and cholecystitis were slightly dilated ducts as well as focal cavitation of abdominal aortic to 3 cm  Review of Systems: The patient denies chest pain  blurred vision falls weakness one side of the body seizures headaches chills fever cough hemoptysis rash sick contacts ill contacts   Past Medical History  Diagnosis Date  . Shortness of breath on exertion 01/15/2010    2D Echo - EF >55%; Nuclear Stress Test 01/14/2010 - EKG negative for ischemia, normal myocardial perfusion study  . Hypertension   . Hyperlipidemia   . Mild aortic stenosis   . Paroxysmal atrial fibrillation     rate controlled on diltiazem and anticoagulated on Xarelto to   Past Surgical History  Procedure Laterality Date  . Cataract extraction, bilateral    . Tonsillectomy    . Eye surgery    . Hernia repair      right side  . Appendectomy     Social History:  reports that he has quit smoking. He quit smokeless tobacco use about 44 years ago. He reports that  drinks alcohol. He reports that he does not use illicit drugs.  No Known Allergies  Family History  Problem Relation Age of Onset  . Lung disease Father      Prior to Admission medications   Medication Sig Start Date End Date Taking? Authorizing Provider  aspirin 81 MG EC tablet Take 81 mg by mouth daily. Swallow whole.   Yes Historical Provider, MD  atorvastatin (LIPITOR) 20 MG tablet Take 20 mg by mouth daily.   Yes Historical Provider, MD  cholecalciferol (VITAMIN D) 1000 UNITS tablet Take 1,000 Units by mouth daily.   Yes Historical Provider, MD  diltiazem (CARDIZEM CD) 240 MG 24 hr capsule Take 1 capsule (240 mg total) by mouth daily. 12/28/12  Yes Wilburt Finlay, PA-C  doxazosin (CARDURA) 4 MG tablet Take 4 mg by mouth at bedtime.   Yes Historical Provider, MD  fish oil-omega-3 fatty acids 1000 MG capsule Take 1 g by mouth daily.   Yes Historical Provider, MD  flunisolide (NASALIDE) 25 MCG/ACT (0.025%) SOLN Inhale 2 sprays into the lungs 2 (two) times daily.   Yes Historical Provider, MD  furosemide (LASIX) 20 MG tablet Take 20 mg by mouth daily.    Yes Historical Provider, MD  Ipratropium-Albuterol  (COMBIVENT RESPIMAT) 20-100 MCG/ACT AERS respimat Inhale 1 puff into the lungs every 6 (six) hours.   Yes Historical Provider, MD  metoprolol succinate (TOPROL-XL) 25 MG 24 hr tablet Take 25 mg by mouth every morning.   Yes Historical Provider, MD  Multiple Vitamin (MULTIVITAMIN) tablet Take 1 tablet by mouth daily.   Yes Historical Provider, MD  omeprazole (PRILOSEC) 20 MG capsule Take 20 mg by mouth daily.   Yes Historical Provider, MD  Rivaroxaban (XARELTO) 15 MG TABS tablet Take 15 mg by mouth every morning.   Yes Historical Provider, MD  Rivaroxaban (XARELTO) 15 MG TABS tablet Take 1 tablet (15 mg total) by mouth daily. 01/18/13   Runell Gess, MD   Physical Exam: Filed Vitals:   04/18/13 1528  BP: 105/44  Pulse: 65  Temp:   Resp:      General:  Alert pleasant oriented   Eyes: EOMI  ENT:  MOderate  Neck: mild JVD, no bruit  Cardiovascular:  s1 s2 no m/r/g, and RRR  Respiratory:  clear no added sound   Abdomen:  nontender nondistended no rebound no guarding  Skin:  lower extremity edema   Musculoskeletal:  range of motion intact   Psychiatric:  euthymic pleasant oriented   Neurologic:  grossly intact moving all 4 limbs equally no apparent   Labs on Admission:  Basic Metabolic Panel:  Recent Labs Lab 04/18/13 0908  NA 136  K 3.9  CL 100  CO2 27  GLUCOSE 150*  BUN 20  CREATININE 1.33  CALCIUM 9.1   Liver Function Tests:  Recent Labs Lab 04/18/13 0908  AST 1009*  ALT 450*  ALKPHOS 191*  BILITOT 1.2  PROT 7.1  ALBUMIN 2.9*    Recent Labs Lab 04/18/13 0908  LIPASE 36   No results found for this basename: AMMONIA,  in the last 168 hours CBC:  Recent Labs Lab 04/18/13 0908  WBC 9.7  NEUTROABS 8.1*  HGB 11.1*  HCT 31.5*  MCV 95.5  PLT 191   Cardiac Enzymes:  Recent Labs Lab 04/18/13 0908  TROPONINI <0.30    BNP (last 3 results) No results found for this basename: PROBNP,  in the last 8760 hours CBG: No results found for  this basename: GLUCAP,  in the last 168 hours  Radiological Exams on Admission: US Abdomen Complete  04/18/2013   *RADIOLOGY REPORT*  Clinical Data:  The small bowel abdominal pain and elevated liver function tests.  COMPLETE ABDOMINAL ULTRASOUND  Comparison:  CT scan of the abdomen dated 02/07/2009  Findings:  Gallbladder:  The gallbladder is quite distended with the thickening of the gallbladderwall and pericholecystic fluid as well as sludge and stones in the gallbladder.  Findings are consistent with acute cholecystitis.  However, the patient has a negative sonographic Murphy's sign.  Common bile duct:  Common bile duct is 7.4 mm in  diameter.  There is slight intrahepatic ductal dilatation.  No visible common bile duct stones.  Liver:  Normal slightly dilated intrahepatic ducts.  No focal mass lesions.  IVC:  Normal.  Pancreas:  The pancreas is normal except for dilatation of the pancreatic duct to 3.8 mm.  Spleen:  Normal.  And 7.2 cm in length.  Right Kidney:  10.9 cm in length.  Multiple renal calculi.  Left Kidney:  Normal.  9.0 cm in length.  Abdominal aorta:  Maximum diameter is approximately 3 cm at a focal bulge in the mid abdominal aorta.  IMPRESSION:  1.  Cholelithiasis with edematous gallbladder wall and pericholecystic fluid consistent with cholecystitis. 2.  Slightly dilated intra and extrahepatic bile ducts and dilated pancreatic duct consistent with distal common bile duct obstruction.  No discrete stone is identified on this exam however. 3.  Multiple right renal calculi. 4.  Focal dilatation of the abdominal aorta to a diameter of 3 cm.   Original Report Authenticated By: Francene Boyers, M.D.    EKG: Independently reviewed.  sinus rhythm   Assessment/Plan Principal Problem:   Cholecystitis, acute Active Problems:   Paroxysmal atrial fibrillation   Essential hypertension   Hyperlipidemia   Aortic sclerosis   Diastolic dysfunction- grade 2   Chronic anticoagulation- Xarelto for  PAF   Chronic renal insufficiency, stage III (moderate)   1. Acute cholecystitis -appreciate general surgery input-suspect patient will need 2-3 days of being off of his arrival to . Will start IV heparin per pharmacy consult 2 prevent embolic phenomenon from A. fib. General surgery to manage acute pain issues, antibiotic choice = Zosyn , as well as diet = n.p.o. currently with ice chips. Suspect patient will have no problems with surgery -patient's functional capacity is about 4 Mets-type of surgery is characterized as moderate risk.  From my standpoint he is clear for surgery. Per ACC guidelines, would continue perioperative beta blockade , statin-would hold aspirin however as the only benefit to this in males below 65 is primary prevention for acute CVA , and patient already is on Xarelto, Now heparin Gtt  2. H/o Pafib, elevated CHad2Vasc2- currently normal sinus rhythm-continue beta blocker metoprolol 25 every morning as well as Cardizem 240 daily  3.  hyperlipidemia-see above in addition continue Lipitor 20 mg daily  4. 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

## 2013-04-18 NOTE — Progress Notes (Deleted)
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 Dwain Sarna who home this morning abdominal pain starting at around 3 AM. He states it was noted first and is as happened He had an episode of this about 3 weeks ago and thought this was gas or indigestion. The pain was 5/10 in intensity and was constant and it was worsened by moving around. He did not try any pain medications for this.  he states because of the severity of the pain he thought it would be best to come to the emergency room for evaluation of the same. He did have associated nausea but did not have any specific vomiting and could not throw up. He denies any dark stool or tarry stool recently and had a normal regular bowel movement yesterday.  At baseline he is highly functional and able to walk 3 city blocks.  He has steps at home and can climb them up and down without difficulty but does walk them slower than he used to, probably because of his underlying CHF. He is not a current smoker and quit smoking in 1970. He lives with his wife and his children are involved in his care   Emergency room evaluation revealed alkaline phosphatase of 191 of mental 0.9 AST 1009/ALT 450   hemoglobin 11.1 with neutrophilia Urinalysis showed hyaline casts with many bacteria Ultrasound abdomen pelvis showed cholelithiasis with a gallbladder wall and cholecystitis were slightly dilated ducts as well as focal cavitation of abdominal aortic to 3 cm  Review of Systems: The patient denies chest pain blurred vision falls weakness  one side of the body seizures headaches chills fever cough hemoptysis rash sick contacts ill contacts   Past Medical History  Diagnosis Date  . Shortness of breath on exertion 01/15/2010    2D Echo - EF >55%; Nuclear Stress Test 01/14/2010 - EKG negative for ischemia, normal myocardial perfusion study  . Hypertension   . Hyperlipidemia   . Mild aortic stenosis   . Paroxysmal atrial fibrillation     rate controlled on diltiazem and anticoagulated on Xarelto to   History reviewed. No pertinent past surgical history. Social History:  reports that he has quit smoking. He quit smokeless tobacco use about 44 years ago. He reports that  drinks alcohol. He reports that he does not use illicit drugs.  No Known Allergies  Family History  Problem Relation Age of Onset  . Lung disease Father      Prior to Admission medications   Medication Sig Start Date End Date Taking? Authorizing Provider  aspirin 81 MG EC tablet Take 81 mg by mouth daily. Swallow whole.   Yes Historical Provider, MD  atorvastatin (LIPITOR) 20 MG tablet Take 20 mg by mouth daily.   Yes Historical Provider, MD  cholecalciferol (VITAMIN D) 1000 UNITS tablet Take 1,000 Units by mouth daily.   Yes Historical Provider, MD  diltiazem (CARDIZEM CD) 240 MG 24 hr capsule Take 1 capsule (240 mg total) by mouth daily. 12/28/12  Yes Wilburt Finlay, PA-C  doxazosin (CARDURA) 4 MG tablet Take 4 mg by mouth at bedtime.   Yes Historical Provider, MD  fish oil-omega-3 fatty acids 1000 MG capsule Take 1 g by mouth daily.   Yes Historical Provider, MD  flunisolide (NASALIDE) 25 MCG/ACT (0.025%) SOLN Inhale 2 sprays into the lungs 2 (two) times daily.   Yes Historical Provider, MD  furosemide (LASIX) 20 MG tablet Take 20 mg by mouth daily.    Yes Historical Provider, MD  Ipratropium-Albuterol (COMBIVENT RESPIMAT) 20-100 MCG/ACT AERS respimat Inhale 1 puff into the lungs every 6 (six) hours.   Yes Historical Provider, MD  metoprolol succinate  (TOPROL-XL) 25 MG 24 hr tablet Take 25 mg by mouth every morning.   Yes Historical Provider, MD  Multiple Vitamin (MULTIVITAMIN) tablet Take 1 tablet by mouth daily.   Yes Historical Provider, MD  omeprazole (PRILOSEC) 20 MG capsule Take 20 mg by mouth daily.   Yes Historical Provider, MD  Rivaroxaban (XARELTO) 15 MG TABS tablet Take 15 mg by mouth every morning.   Yes Historical Provider, MD  Rivaroxaban (XARELTO) 15 MG TABS tablet Take 1 tablet (15 mg total) by mouth daily. 01/18/13   Runell Gess, MD   Physical Exam: Filed Vitals:   04/18/13 0836  BP: 129/58  Pulse: 75  Temp: 98.6 F (37 C)  Resp: 20     General:  Alert pleasant oriented   Eyes: EOMI  ENT:  MOderate  Neck: mild JVD, no bruit  Cardiovascular:  s1 s2 no m/r/g, and RRR  Respiratory:  clear no added sound   Abdomen:  nontender nondistended no rebound no guarding  Skin:  lower extremity edema   Musculoskeletal:  range of motion intact   Psychiatric:  euthymic pleasant oriented   Neurologic:  grossly intact moving all 4 limbs equally no apparent   Labs on Admission:  Basic Metabolic Panel:  Recent Labs Lab 04/18/13 0908  NA 136  K 3.9  CL 100  CO2 27  GLUCOSE 150*  BUN 20  CREATININE 1.33  CALCIUM 9.1   Liver Function Tests:  Recent Labs Lab 04/18/13 0908  AST 1009*  ALT 450*  ALKPHOS 191*  BILITOT 1.2  PROT 7.1  ALBUMIN 2.9*    Recent Labs Lab 04/18/13 0908  LIPASE 36   No results found for this basename: AMMONIA,  in the last 168 hours CBC:  Recent Labs Lab 04/18/13 0908  WBC 9.7  NEUTROABS 8.1*  HGB 11.1*  HCT 31.5*  MCV 95.5  PLT 191   Cardiac Enzymes:  Recent Labs Lab 04/18/13 0908  TROPONINI <0.30    BNP (last 3 results) No results found for this basename: PROBNP,  in the last 8760 hours CBG: No results found for this basename: GLUCAP,  in the last 168 hours  Radiological Exams on Admission: US Abdomen Complete  04/18/2013   *RADIOLOGY REPORT*   Clinical Data:  The small bowel abdominal pain and elevated liver function tests.  COMPLETE ABDOMINAL ULTRASOUND  Comparison:  CT scan of the abdomen dated 02/07/2009  Findings:  Gallbladder:  The gallbladder is quite distended with the thickening of the gallbladderwall and pericholecystic fluid as well as sludge and stones in the gallbladder.  Findings are consistent with acute cholecystitis.  However, the patient has a negative sonographic Murphy's sign.  Common bile duct:  Common bile duct is 7.4 mm in diameter.  There is slight intrahepatic ductal dilatation.  No visible common bile duct stones.  Liver:  Normal slightly dilated intrahepatic ducts.  No focal mass lesions.  IVC:  Normal.  Pancreas:  The pancreas is normal except  for dilatation of the pancreatic duct to 3.8 mm.  Spleen:  Normal.  And 7.2 cm in length.  Right Kidney:  10.9 cm in length.  Multiple renal calculi.  Left Kidney:  Normal.  9.0 cm in length.  Abdominal aorta:  Maximum diameter is approximately 3 cm at a focal bulge in the mid abdominal aorta.  IMPRESSION:  1.  Cholelithiasis with edematous gallbladder wall and pericholecystic fluid consistent with cholecystitis. 2.  Slightly dilated intra and extrahepatic bile ducts and dilated pancreatic duct consistent with distal common bile duct obstruction.  No discrete stone is identified on this exam however. 3.  Multiple right renal calculi. 4.  Focal dilatation of the abdominal aorta to a diameter of 3 cm.   Original Report Authenticated By: Francene Boyers, M.D.    EKG: Independently reviewed.  sinus rhythm   Assessment/Plan Principal Problem:   Cholecystitis, acute Active Problems:   Paroxysmal atrial fibrillation   Essential hypertension   Hyperlipidemia   1. Acute cholecystitis -appreciate general surgery input-suspect patient will need 2-3 days of being off of his arrival to . Will start IV heparin per pharmacy consult 2 prevent embolic phenomenon from A. fib. General surgery to  manage acute pain issues, antibiotic choice = Zosyn , as well as diet = n.p.o. currently with ice chips. Suspect patient will have no problems with surgery -patient's functional capacity is about 4 Mets-type of surgery is characterized as moderate risk.  From my standpoint he is clear for surgery. Per ACC guidelines, would continue perioperative beta blockade , statin-would hold aspirin however as the only benefit to this in males below 65 is primary prevention for acute CVA , and patient already is on Xarelto, Now heparin Gtt  2. H/o Pafib, elevated CHad2Vasc2- currently normal sinus rhythm-continue beta blocker metoprolol 25 every morning as well as Cardizem 240 daily  3.  hyperlipidemia-see above in addition continue Lipitor 20 mg daily  4. 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

## 2013-04-19 ENCOUNTER — Inpatient Hospital Stay (HOSPITAL_COMMUNITY): Payer: Medicare Other

## 2013-04-19 DIAGNOSIS — I4891 Unspecified atrial fibrillation: Secondary | ICD-10-CM

## 2013-04-19 DIAGNOSIS — I10 Essential (primary) hypertension: Secondary | ICD-10-CM

## 2013-04-19 LAB — COMPREHENSIVE METABOLIC PANEL
Albumin: 2.3 g/dL — ABNORMAL LOW (ref 3.5–5.2)
Alkaline Phosphatase: 187 U/L — ABNORMAL HIGH (ref 39–117)
BUN: 20 mg/dL (ref 6–23)
CO2: 29 mEq/L (ref 19–32)
Chloride: 103 mEq/L (ref 96–112)
GFR calc non Af Amer: 41 mL/min — ABNORMAL LOW (ref >90)
Glucose, Bld: 79 mg/dL (ref 70–99)
Potassium: 4.1 mEq/L (ref 3.5–5.1)
Total Bilirubin: 2.1 mg/dL — ABNORMAL HIGH (ref 0.3–1.2)

## 2013-04-19 LAB — CBC
HCT: 29.8 % — ABNORMAL LOW (ref 39.0–52.0)
Hemoglobin: 9.9 g/dL — ABNORMAL LOW (ref 13.0–17.0)
MCH: 32.4 pg (ref 26.0–34.0)
MCV: 97.4 fL (ref 78.0–100.0)
RBC: 3.06 MIL/uL — ABNORMAL LOW (ref 4.22–5.81)
WBC: 8.1 10*3/uL (ref 4.0–10.5)

## 2013-04-19 LAB — URINE CULTURE: Culture: NO GROWTH

## 2013-04-19 MED ORDER — GADOBENATE DIMEGLUMINE 529 MG/ML IV SOLN
7.0000 mL | Freq: Once | INTRAVENOUS | Status: AC | PRN
  Administered 2013-04-19: 7 mL via INTRAVENOUS

## 2013-04-19 MED ORDER — DILTIAZEM HCL ER COATED BEADS 120 MG PO CP24
120.0000 mg | ORAL_CAPSULE | Freq: Every day | ORAL | Status: DC
  Filled 2013-04-19 (×2): qty 1

## 2013-04-19 MED ORDER — METOPROLOL SUCCINATE ER 25 MG PO TB24
25.0000 mg | ORAL_TABLET | Freq: Every morning | ORAL | Status: DC
  Administered 2013-04-21 – 2013-04-24 (×4): 25 mg via ORAL
  Filled 2013-04-19 (×5): qty 1

## 2013-04-19 MED ORDER — KCL IN DEXTROSE-NACL 20-5-0.9 MEQ/L-%-% IV SOLN
INTRAVENOUS | Status: DC
  Administered 2013-04-19 – 2013-04-21 (×3): via INTRAVENOUS
  Filled 2013-04-19 (×5): qty 1000

## 2013-04-19 MED ORDER — DOXAZOSIN MESYLATE 2 MG PO TABS
2.0000 mg | ORAL_TABLET | Freq: Every day | ORAL | Status: DC
  Administered 2013-04-19 – 2013-04-20 (×2): 2 mg via ORAL
  Filled 2013-04-19 (×4): qty 1

## 2013-04-19 MED ORDER — SODIUM CHLORIDE 0.9 % IV SOLN
3.0000 g | Freq: Three times a day (TID) | INTRAVENOUS | Status: DC
  Administered 2013-04-19 – 2013-04-21 (×7): 3 g via INTRAVENOUS
  Filled 2013-04-19 (×7): qty 3

## 2013-04-19 MED ORDER — VITAMIN K1 10 MG/ML IJ SOLN
10.0000 mg | Freq: Once | INTRAMUSCULAR | Status: AC
  Administered 2013-04-19: 10 mg via INTRAVENOUS
  Filled 2013-04-19: qty 1

## 2013-04-19 NOTE — Consult Note (Signed)
Seen, agree, examined.  Pt with mild acute cholecystitis.  Holding xarelto.  Recheck LFTs.  May need GI consult.

## 2013-04-19 NOTE — Progress Notes (Signed)
Seen, agree. Bi8lirubin up today. Will consult GI and get MRCP.  May need ercp.   Otherwise plan lap chole tomorrow or Saturday.

## 2013-04-19 NOTE — Progress Notes (Signed)
Subjective: RUQ and epigastric pain, somewhat better today.  Denies chills, sweats, sob, cp.  Denies dysuria.  BP soft, IM and cards managing   Objective: Vital signs in last 24 hours: Temp:  [97.3 F (36.3 C)-98.6 F (37 C)] 97.3 F (36.3 C) (09/18 0737) Pulse Rate:  [57-75] 57 (09/18 0558) Resp:  [16-20] 16 (09/18 0558) BP: (90-129)/(44-58) 98/52 mmHg (09/18 0737) SpO2:  [91 %-100 %] 91 % (09/18 0558) Weight:  [144 lb (65.318 kg)-144 lb 2.9 oz (65.4 kg)] 144 lb 2.9 oz (65.4 kg) (09/18 4098) Last BM Date: 04/17/13  Intake/Output from previous day: 09/17 0701 - 09/18 0700 In: 967.5 [I.V.:967.5] Out: 750 [Urine:750] Intake/Output this shift:   PE General appearance: alert, cooperative, appears stated age and no distress Resp: clear to auscultation bilaterally Cardio: regular rate and rhythm, S1, S2 normal, no murmur, click, rub or gallop GI: +bs soft and tender to epigastric and ruq, +murphys sign.  No evidence of peritonitis.   Extremities: extremities normal, atraumatic, no cyanosis or edema  Lab Results:   Recent Labs  04/18/13 0908 04/19/13 0420  WBC 9.7 8.1  HGB 11.1* 9.9*  HCT 31.5* 29.8*  PLT 191 166   BMET  Recent Labs  04/18/13 0908 04/19/13 0420  NA 136 137  K 3.9 4.1  CL 100 103  CO2 27 29  GLUCOSE 150* 79  BUN 20 20  CREATININE 1.33 1.46*  CALCIUM 9.1 8.7   PT/INR  Recent Labs  04/19/13 0420  LABPROT 22.9*  INR 2.10*   ABG No results found for this basename: PHART, PCO2, PO2, HCO3,  in the last 72 hours  Studies/Results: US Abdomen Complete  04/18/2013   *RADIOLOGY REPORT*  Clinical Data:  The small bowel abdominal pain and elevated liver function tests.  COMPLETE ABDOMINAL ULTRASOUND  Comparison:  CT scan of the abdomen dated 02/07/2009  Findings:  Gallbladder:  The gallbladder is quite distended with the thickening of the gallbladderwall and pericholecystic fluid as well as sludge and stones in the gallbladder.  Findings are  consistent with acute cholecystitis.  However, the patient has a negative sonographic Murphy's sign.  Common bile duct:  Common bile duct is 7.4 mm in diameter.  There is slight intrahepatic ductal dilatation.  No visible common bile duct stones.  Liver:  Normal slightly dilated intrahepatic ducts.  No focal mass lesions.  IVC:  Normal.  Pancreas:  The pancreas is normal except for dilatation of the pancreatic duct to 3.8 mm.  Spleen:  Normal.  And 7.2 cm in length.  Right Kidney:  10.9 cm in length.  Multiple renal calculi.  Left Kidney:  Normal.  9.0 cm in length.  Abdominal aorta:  Maximum diameter is approximately 3 cm at a focal bulge in the mid abdominal aorta.  IMPRESSION:  1.  Cholelithiasis with edematous gallbladder wall and pericholecystic fluid consistent with cholecystitis. 2.  Slightly dilated intra and extrahepatic bile ducts and dilated pancreatic duct consistent with distal common bile duct obstruction.  No discrete stone is identified on this exam however. 3.  Multiple right renal calculi. 4.  Focal dilatation of the abdominal aorta to a diameter of 3 cm.   Original Report Authenticated By: Francene Boyers, M.D.    Anti-infectives: Anti-infectives   Start     Dose/Rate Route Frequency Ordered Stop   04/18/13 1600  ciprofloxacin (CIPRO) IVPB 400 mg     400 mg 200 mL/hr over 60 Minutes Intravenous Every 12 hours 04/18/13 1433  04/18/13 1145  piperacillin-tazobactam (ZOSYN) IVPB 3.375 g     3.375 g 100 mL/hr over 30 Minutes Intravenous  Once 04/18/13 1141 04/18/13 1337      Assessment/Plan: Acute Cholecystitis, cholelithiasis -Cipro 9/17, change to Unasyn -may have clears, NPO after midnight for surgery tomorrow -cleared by cardiology, appreciate consult -pain control -start IVF -AST/ALT down, bilirubin increased, repeat in AM.  May need GI pending IOC. -xarelto last dose 6PM 9/16.  INR 2.1, repeat in AM.  Discuss with Dr. Donell Beers if further intervention is necessary -ambulate  with assist due to hx of dizziness, although pt mowed the lawn 2 days ago and is fairly active. -VTE prophylaxis; SCDs, mobilize, ?when to start heparin/lovenox  Acute renal insufficiency -UTI v. Dehydration.  Start IVF -repeat BMP in AM  Probable UTI -Negative nitrates, urine culture pending. -Cipro, unasyn. Await c&s   LOS: 1 day   Bonner Puna [facility] ANP-BC Pager [phone]  04/19/2013 8:34 AM

## 2013-04-19 NOTE — Progress Notes (Signed)
Going for MRI at [facility], needs to be NPO.  May have a clear liquid tray after MRI then NPO after MN for either ERCP or Lap Chole Dr. Elnoria Howard called consult Vitamin K 10mg  IV, recheck INR in AM.  Mauri Temkin, ANP-BC

## 2013-04-19 NOTE — Consult Note (Signed)
Reason for Consult: Acute cholecystitis, Elevated liver enzymes Referring Physician: [facility]  [name] HPI: This is an 79 year old male with PMH of HTN PAF, HTN, and hyperlipidemia admitted for acute and persistent upper abdominal pain.  The patient had this type of pain a few weeks ago, but it resolved.  During this event his pain continued to persist.  A RUQ U/S was performed and it revealed cholelithiasis, acute cholecystitis, and mild intrahepatic biliary ductal dilation.  No evidence of any pancreatitis with his blood work, but his liver enzymes were markedly elevated.  A follow liver panel reveals a drop in his liver enzymes and his epigastric/RUQ pain has markedly improved.  It gradually improved and he only had one dose of pain medication at the time of admission.  Past Medical History  Diagnosis Date  . Shortness of breath on exertion 01/15/2010    2D Echo - EF >55%; Nuclear Stress Test 01/14/2010 - EKG negative for ischemia, normal myocardial perfusion study  . Hypertension   . Hyperlipidemia   . Mild aortic stenosis   . Paroxysmal atrial fibrillation     rate controlled on diltiazem and anticoagulated on Xarelto to    Past Surgical History  Procedure Laterality Date  . Cataract extraction, bilateral    . Tonsillectomy    . Eye surgery    . Hernia repair      right side  . Appendectomy      Family History  Problem Relation Age of Onset  . Lung disease Father     Social History:  reports that he has quit smoking. He quit smokeless tobacco use about 44 years ago. He reports that  drinks alcohol. He reports that he does not use illicit drugs.  Allergies: No Known Allergies  Medications:  Scheduled: . ampicillin-sulbactam (UNASYN) IV  3 g Intravenous Q8H  . atorvastatin  20 mg Oral Daily  . cholecalciferol  1,000 Units Oral Daily  . diltiazem  120 mg Oral Daily  . doxazosin  2 mg Oral QHS  . metoprolol succinate  25 mg Oral q morning - 10a  .  pantoprazole  40 mg Oral Daily   Continuous: . dextrose 5 % and 0.9 % NaCl with KCl 20 mEq/L 75 mL/hr at 04/19/13 1345    Results for orders placed during the hospital encounter of 04/18/13 (from the past 24 hour(s))  COMPREHENSIVE METABOLIC PANEL     Status: Abnormal   Collection Time    04/19/13  4:20 AM      Result Value Range   Sodium 137  135 - 145 mEq/L   Potassium 4.1  3.5 - 5.1 mEq/L   Chloride 103  96 - 112 mEq/L   CO2 29  19 - 32 mEq/L   Glucose, Bld 79  70 - 99 mg/dL   BUN 20  6 - 23 mg/dL   Creatinine, Ser 4.09 (*) 0.50 - 1.35 mg/dL   Calcium 8.7  8.4 - 81.1 mg/dL   Total Protein 6.0  6.0 - 8.3 g/dL   Albumin 2.3 (*) 3.5 - 5.2 g/dL   AST 914 (*) 0 - 37 U/L   ALT 385 (*) 0 - 53 U/L   Alkaline Phosphatase 187 (*) 39 - 117 U/L   Total Bilirubin 2.1 (*) 0.3 - 1.2 mg/dL   GFR calc non Af Amer 41 (*) >90 mL/min   GFR calc Af Amer 47 (*) >90 mL/min  PROTIME-INR     Status: Abnormal  Collection Time    04/19/13  4:20 AM      Result Value Range   Prothrombin Time 22.9 (*) 11.6 - 15.2 seconds   INR 2.10 (*) 0.00 - 1.49  APTT     Status: Abnormal   Collection Time    04/19/13  4:20 AM      Result Value Range   aPTT 42 (*) 24 - 37 seconds  CBC     Status: Abnormal   Collection Time    04/19/13  4:20 AM      Result Value Range   WBC 8.1  4.0 - 10.5 K/uL   RBC 3.06 (*) 4.22 - 5.81 MIL/uL   Hemoglobin 9.9 (*) 13.0 - 17.0 g/dL   HCT 96.2 (*) 95.2 - 84.1 %   MCV 97.4  78.0 - 100.0 fL   MCH 32.4  26.0 - 34.0 pg   MCHC 33.2  30.0 - 36.0 g/dL   RDW 32.4  40.1 - 02.7 %   Platelets 166  150 - 400 K/uL  LIPASE, BLOOD     Status: None   Collection Time    04/19/13  4:20 AM      Result Value Range   Lipase 15  11 - 59 U/L     US Abdomen Complete  04/18/2013   *RADIOLOGY REPORT*  Clinical Data:  The small bowel abdominal pain and elevated liver function tests.  COMPLETE ABDOMINAL ULTRASOUND  Comparison:  CT scan of the abdomen dated 02/07/2009  Findings:  Gallbladder:   The gallbladder is quite distended with the thickening of the gallbladderwall and pericholecystic fluid as well as sludge and stones in the gallbladder.  Findings are consistent with acute cholecystitis.  However, the patient has a negative sonographic Murphy's sign.  Common bile duct:  Common bile duct is 7.4 mm in diameter.  There is slight intrahepatic ductal dilatation.  No visible common bile duct stones.  Liver:  Normal slightly dilated intrahepatic ducts.  No focal mass lesions.  IVC:  Normal.  Pancreas:  The pancreas is normal except for dilatation of the pancreatic duct to 3.8 mm.  Spleen:  Normal.  And 7.2 cm in length.  Right Kidney:  10.9 cm in length.  Multiple renal calculi.  Left Kidney:  Normal.  9.0 cm in length.  Abdominal aorta:  Maximum diameter is approximately 3 cm at a focal bulge in the mid abdominal aorta.  IMPRESSION:  1.  Cholelithiasis with edematous gallbladder wall and pericholecystic fluid consistent with cholecystitis. 2.  Slightly dilated intra and extrahepatic bile ducts and dilated pancreatic duct consistent with distal common bile duct obstruction.  No discrete stone is identified on this exam however. 3.  Multiple right renal calculi. 4.  Focal dilatation of the abdominal aorta to a diameter of 3 cm.   Original Report Authenticated By: Francene Boyers, M.D.    ROS:  As stated above in the HPI otherwise negative.  Blood pressure 92/54, pulse 50, temperature 97.8 F (36.6 C), temperature source Oral, resp. rate 16, height 5\' 7"  (1.702 m), weight 144 lb 2.9 oz (65.4 kg), SpO2 91.00%.    PE: Gen: NAD, Alert and Oriented HEENT:  [place]/AT, EOMI Neck: Supple, no LAD Lungs: CTA Bilaterally CV: RRR without M/G/R ABM: Soft, NTND, +BS Ext: No C/C/E  Assessment/Plan: 1) Cholecystitis. 2) ? Choledocholithiasis. 3) Abnormal liver enzymes.   The patient may have passed his stone with the current liver enzymes and his pain level.  However, it is prudent to  pursue an MRCP to make  sure that there are no stones in the CBD.  Plan: 1) Await MRCP. 2) Continue with Cipro. 3) ERCP tomorrow if the MRCP is positive for choledocholithiasis.  Gayle Collard D 04/19/2013, 1:59 PM

## 2013-04-19 NOTE — Progress Notes (Signed)
Subjective:  Called by Dr Mahala Menghini this am, the pt has been hypotensive here in the hospital. In retrospect he was probably having some orthostatic symptoms at home as well. Diltiazem has been decreased to 120 mg daily from 240 mg daily. This could be stopped altogether if needed but would continue beta blocker if at all possible. I believe he is on Cardura for BPH, not HTN, and this has been cut back as well. He has been cleared for surgery by Dr Rennis Golden.  Objective:  Vital Signs in the last 24 hours: Temp:  [97.3 F (36.3 C)-98 F (36.7 C)] 97.8 F (36.6 C) (09/18 1343) Pulse Rate:  [50-65] 50 (09/18 1343) Resp:  [16] 16 (09/18 0558) BP: (90-105)/(42-54) 92/54 mmHg (09/18 1343) SpO2:  [91 %-93 %] 91 % (09/18 0558) Weight:  [144 lb (65.318 kg)-144 lb 2.9 oz (65.4 kg)] 144 lb 2.9 oz (65.4 kg) (09/18 4098)  Intake/Output from previous day:  Intake/Output Summary (Last 24 hours) at 04/19/13 1358 Last data filed at 04/19/13 1345  Gross per 24 hour  Intake 2017.5 ml  Output    752 ml  Net 1265.5 ml    Physical Exam: General appearance: alert, cooperative and no distress Lungs: kyphosis, few basilar crackles Heart: regular rate and rhythm   Rate: 60  Rhythm: normal sinus rhythm  Lab Results:  Recent Labs  04/18/13 0908 04/19/13 0420  WBC 9.7 8.1  HGB 11.1* 9.9*  PLT 191 166    Recent Labs  04/18/13 0908 04/19/13 0420  NA 136 137  K 3.9 4.1  CL 100 103  CO2 27 29  GLUCOSE 150* 79  BUN 20 20  CREATININE 1.33 1.46*    Recent Labs  04/18/13 0908  TROPONINI <0.30    Recent Labs  04/19/13 0420  INR 2.10*    Imaging: Imaging results have been reviewed  Cardiac Studies:  Assessment/Plan:   Principal Problem:   Cholecystitis, acute Active Problems:   Paroxysmal atrial fibrillation   Chronic anticoagulation- Xarelto for PAF   Chronic renal insufficiency, stage III (moderate)   Essential hypertension   Hyperlipidemia   Aortic sclerosis   Diastolic  dysfunction- grade 2    PLAN: As noted above, Cardura and Diltiazem decreased.  Corine Shelter PA-C Beeper [phone] 04/19/2013, 1:58 PM

## 2013-04-19 NOTE — Progress Notes (Addendum)
Jeff Barrett:[id number] DOB: [birth year] DOA: 04/18/2013 PCP: Thayer Headings, MD  Brief narrative: 79 yr old ? former [employer] vet , known h/o hypertension, PAF c Elevated CHADS2Vasc2 score on Xarelto, s/p Cardionet for Tachy-brady hyperlipidemia, mild aortic stenosis and chronic left lower extremity edema, with h/o Appendectomy 02/07/2009 by Dr. Dwain Sarna presented 9/17 with Acute abd pain. [place] Abd=Acute cholecystitis-LFTS markedly elevated, have trended down.  Nursing concerns about borderline hypotension, ? Hypoxia while sleeping.  Cardiology consulted by Gen surg regarding clearance and clearance obtained for cholecsytectomy  Past medical history-As per Problem list  Consultants:  Gen Surgery-[facility]  Cardiology-[facility]  Procedures:  US abdomen  Antibiotics:  Ciprofloxacin 9/17   Subjective  Well.  Joking.  Mild abd pain overnight.  No n/v/cp/sob Ambulated with mild dizzyness O/n States has had dizzyness with his "240 mg" tablet Thats why he take sit at night   Objective    Interim History: None   Telemetry: NSR per my auscultation  Objective: Filed Vitals:   04/18/13 2208 04/19/13 0558 04/19/13 0652 04/19/13 0737  BP: 91/50 90/45  98/52  Pulse: 63 57    Temp: 98 F (36.7 C) 97.9 F (36.6 C)  97.3 F (36.3 C)  TempSrc: Oral Oral  Oral  Resp: 16 16    Height:      Weight:   65.4 kg (144 lb 2.9 oz)   SpO2: 93% 91%      Intake/Output Summary (Last 24 hours) at 04/19/13 0853 Last data filed at 04/19/13 0603  Gross per 24 hour  Intake  967.5 ml  Output    750 ml  Net  217.5 ml    Exam:  General: alert ,pleasant oriente din NAD Cardiovascular: s1 s2 NSR RRR Respiratory: clear Abdomen: soft, NT,ND Skin soft, Trace edema Neuro ROM intact  Data Reviewed: Basic Metabolic Panel:  Recent Labs Lab 04/18/13 0908 04/19/13 0420  NA 136 137  K 3.9 4.1  CL 100 103  CO2 27 29  GLUCOSE 150* 79  BUN 20 20  CREATININE 1.33 1.46*  CALCIUM 9.1 8.7    Liver Function Tests:  Recent Labs Lab 04/18/13 0908 04/19/13 0420  AST 1009* 580*  ALT 450* 385*  ALKPHOS 191* 187*  BILITOT 1.2 2.1*  PROT 7.1 6.0  ALBUMIN 2.9* 2.3*    Recent Labs Lab 04/18/13 0908 04/19/13 0420  LIPASE 36 15   No results found for this basename: AMMONIA,  in the last 168 hours CBC:  Recent Labs Lab 04/18/13 0908 04/19/13 0420  WBC 9.7 8.1  NEUTROABS 8.1*  --   HGB 11.1* 9.9*  HCT 31.5* 29.8*  MCV 95.5 97.4  PLT 191 166   Cardiac Enzymes:  Recent Labs Lab 04/18/13 0908  TROPONINI <0.30   BNP: No components found with this basename: POCBNP,  CBG: No results found for this basename: GLUCAP,  in the last 168 hours  No results found for this or any previous visit (from the past 240 hour(s)).   Studies:              All Imaging reviewed and is as per above notation   Scheduled Meds: . atorvastatin  20 mg Oral Daily  . cholecalciferol  1,000 Units Oral Daily  . ciprofloxacin  400 mg Intravenous Q12H  . diltiazem  240 mg Oral Daily  . doxazosin  4 mg Oral QHS  . metoprolol succinate  25 mg Oral q morning - 10a  . pantoprazole  40 mg Oral Daily  Continuous Infusions: . dextrose 5 % and 0.9 % NaCl with KCl 20 mEq/L       Assessment/Plan: 1. Acute Cholecystitis-General surgery kindly address  Antibiotics  Timing of surgery  Peri-op pain management/fluids  Resumption Anti-coagulation  Ms-[place] of Gen surgery has indicated that IOC will be done  1. Hypotension-iatrogenic, 2/2 to B-blocker, Cardizem + Cardura-Discussed with Corine Shelter of [facility]-recommends cutting Cardizem 240-->120mg  daily.  Will follow. 2. Hypoglycemia-continue D5-saline 75 cc for 12 hours 3. H/o PAF-currently in NSR 4. BPH-cut back cardura dose from 4mg -->2mg  5. HLD-Continue Atorvastatin peri-op based on observational data this is cardio-protective 6. Unlikely UTI-Continue Cipro for Intra-abdominal process.  His urine was "clean catch" and I am not  convinced he has pyelonephritis  Code Status: Full Family Communication: none at bedside Disposition Plan:  Inpatient pedning surghery   Pleas Koch, MD  [facility] Pager [phone] 04/19/2013, 8:53 AM    LOS: 1 day

## 2013-04-19 NOTE — Progress Notes (Signed)
INITIAL NUTRITION ASSESSMENT  Pt meets criteria for moderate MALNUTRITION in the context of chronic illness as evidenced by <75% estimated energy intake in the past few months in addition to pt with severe muscle wasting and subcutaneous fat loss in thoracic and lumbar region with 20% weight loss in the past 1.5 years per pt report.  DOCUMENTATION CODES Per approved criteria  -Severe malnutrition in the context of chronic illness   INTERVENTION: - Diet advancement per MD - Educated pt on high calorie/protein diet to promote preservation of lean body mass as pt with poor appetite PTA - Will continue to monitor   NUTRITION DIAGNOSIS: Inadequate oral intake related to inability to eat as evidenced by NPO.   Goal: Advance diet as tolerated to low fat diet  Monitor:  Weights, labs, diet advancement  Reason for Assessment: Nutrition risk   79 y.o. male  Admitting Dx: Cholecystitis, acute  ASSESSMENT: Pt admitted with epigastric pain, found to have mild acute cholecystitis. Met with pt and family who report pt with 36 pound unintended weight loss in the past 1.5 years. Family reports pt was told to lose 5 pounds by his doctor so he stopped eating sweets and snacks and then lost more than he wanted to. Pt not eating 3 meals/day at home. Pt reports some improvement in epigastric pain. Pt c/o nausea last night, none today. Pt with significantly elevated AST/ALT and elevated Alk phos and total bilirubin.   Nutrition Focused Physical Exam:  Subcutaneous Fat:  Orbital Region: WNL Upper Arm Region: mild/moderate wasting Thoracic and Lumbar Region: severe wasting  Muscle:  Temple Region: WNL Clavicle Bone Region: mild/moderate wasting Clavicle and Acromion Bone Region: mild/moderate wasting Scapular Bone Region: NA Dorsal Hand: WNL Patellar Region: WNL Anterior Thigh Region: mild/moderate wasting Posterior Calf Region: WNL  Edema: None noted   Height: Ht Readings from Last 1  Encounters:  04/18/13 5\' 7"  (1.702 m)    Weight: Wt Readings from Last 1 Encounters:  04/19/13 144 lb 2.9 oz (65.4 kg)    Ideal Body Weight: 148 lb  % Ideal Body Weight: 97%  Wt Readings from Last 10 Encounters:  04/19/13 144 lb 2.9 oz (65.4 kg)  01/18/13 144 lb 8 oz (65.545 kg)  12/28/12 147 lb 9.6 oz (66.951 kg)  12/19/12 143 lb 4.8 oz (65 kg)    Usual Body Weight: 180 lb 1.5 years ago per pt  % Usual Body Weight: 80%  BMI:  Body mass index is 22.58 kg/(m^2).  Estimated Nutritional Needs: Kcal: 1700-1950 Protein: 80-95g Fluid: 1.7-1.9L/day  Skin: Intact   Diet Order: NPO  EDUCATION NEEDS: -Education needs addressed - discussed high calorie/protein diet and provided handouts of this information   Intake/Output Summary (Last 24 hours) at 04/19/13 1423 Last data filed at 04/19/13 1400  Gross per 24 hour  Intake 2017.5 ml  Output   1202 ml  Net  815.5 ml    Last BM: 9/16  Labs:   Recent Labs Lab 04/18/13 0908 04/19/13 0420  NA 136 137  K 3.9 4.1  CL 100 103  CO2 27 29  BUN 20 20  CREATININE 1.33 1.46*  CALCIUM 9.1 8.7  GLUCOSE 150* 79    CBG (last 3)  No results found for this basename: GLUCAP,  in the last 72 hours  Scheduled Meds: . ampicillin-sulbactam (UNASYN) IV  3 g Intravenous Q8H  . atorvastatin  20 mg Oral Daily  . cholecalciferol  1,000 Units Oral Daily  . diltiazem  120  mg Oral Daily  . doxazosin  2 mg Oral QHS  . [START ON 04/20/2013] metoprolol succinate  25 mg Oral q morning - 10a  . pantoprazole  40 mg Oral Daily    Continuous Infusions: . dextrose 5 % and 0.9 % NaCl with KCl 20 mEq/L 75 mL/hr at 04/19/13 1345    Past Medical History  Diagnosis Date  . Shortness of breath on exertion 01/15/2010    2D Echo - EF >55%; Nuclear Stress Test 01/14/2010 - EKG negative for ischemia, normal myocardial perfusion study  . Hypertension   . Hyperlipidemia   . Mild aortic stenosis   . Paroxysmal atrial fibrillation     rate  controlled on diltiazem and anticoagulated on Xarelto to    Past Surgical History  Procedure Laterality Date  . Cataract extraction, bilateral    . Tonsillectomy    . Eye surgery    . Hernia repair      right side  . Appendectomy      Levon Hedger MS, RD, LDN [phone] Pager [phone] After Hours Pager

## 2013-04-20 ENCOUNTER — Encounter (HOSPITAL_COMMUNITY): Payer: Self-pay

## 2013-04-20 ENCOUNTER — Encounter (HOSPITAL_COMMUNITY)
Admission: EM | Disposition: A | Discharge status: Home / Self Care | Payer: Self-pay | Source: Home / Self Care | Attending: Family Medicine

## 2013-04-20 ENCOUNTER — Inpatient Hospital Stay (HOSPITAL_COMMUNITY): Payer: Medicare Other

## 2013-04-20 DIAGNOSIS — I4891 Unspecified atrial fibrillation: Secondary | ICD-10-CM

## 2013-04-20 DIAGNOSIS — N189 Chronic kidney disease, unspecified: Secondary | ICD-10-CM

## 2013-04-20 DIAGNOSIS — E43 Unspecified severe protein-calorie malnutrition: Secondary | ICD-10-CM | POA: Insufficient documentation

## 2013-04-20 HISTORY — PX: ERCP: SHX5425

## 2013-04-20 LAB — TYPE AND SCREEN
ABO/RH(D): O POS
Antibody Screen: NEGATIVE

## 2013-04-20 LAB — CBC
HCT: 29.2 % — ABNORMAL LOW (ref 39.0–52.0)
Hemoglobin: 10 g/dL — ABNORMAL LOW (ref 13.0–17.0)
MCH: 33 pg (ref 26.0–34.0)
MCV: 96.4 fL (ref 78.0–100.0)
Platelets: 168 10*3/uL (ref 150–400)
RBC: 3.03 MIL/uL — ABNORMAL LOW (ref 4.22–5.81)
WBC: 7.1 10*3/uL (ref 4.0–10.5)

## 2013-04-20 LAB — COMPREHENSIVE METABOLIC PANEL
AST: 287 U/L — ABNORMAL HIGH (ref 0–37)
BUN: 20 mg/dL (ref 6–23)
CO2: 28 mEq/L (ref 19–32)
Chloride: 105 mEq/L (ref 96–112)
Creatinine, Ser: 1.57 mg/dL — ABNORMAL HIGH (ref 0.50–1.35)
GFR calc Af Amer: 43 mL/min — ABNORMAL LOW (ref >90)
GFR calc non Af Amer: 37 mL/min — ABNORMAL LOW (ref >90)
Glucose, Bld: 90 mg/dL (ref 70–99)
Total Bilirubin: 1 mg/dL (ref 0.3–1.2)

## 2013-04-20 LAB — PROTIME-INR
INR: 1.52 — ABNORMAL HIGH (ref 0.00–1.49)
INR: 1.3 (ref 0.00–1.49)
Prothrombin Time: 15.9 seconds — ABNORMAL HIGH (ref 11.6–15.2)

## 2013-04-20 LAB — TROPONIN I: Troponin I: <0.3 ng/mL

## 2013-04-20 LAB — ABO/RH: ABO/RH(D): O POS

## 2013-04-20 SURGERY — ERCP, WITH INTERVENTION IF INDICATED
Anesthesia: Moderate Sedation

## 2013-04-20 MED ORDER — MIDAZOLAM HCL 10 MG/2ML IJ SOLN
INTRAMUSCULAR | Status: AC
  Filled 2013-04-20: qty 4

## 2013-04-20 MED ORDER — BUTAMBEN-TETRACAINE-BENZOCAINE 2-2-14 % EX AERO
INHALATION_SPRAY | CUTANEOUS | Status: DC | PRN
  Administered 2013-04-20: 1 via TOPICAL

## 2013-04-20 MED ORDER — FENTANYL CITRATE 0.05 MG/ML IJ SOLN
INTRAMUSCULAR | Status: AC
  Filled 2013-04-20: qty 4

## 2013-04-20 MED ORDER — GLUCAGON HCL (RDNA) 1 MG IJ SOLR
INTRAMUSCULAR | Status: AC
  Filled 2013-04-20: qty 2

## 2013-04-20 MED ORDER — FENTANYL CITRATE 0.05 MG/ML IJ SOLN
INTRAMUSCULAR | Status: DC | PRN
  Administered 2013-04-20 (×4): 25 ug via INTRAVENOUS

## 2013-04-20 MED ORDER — MIDAZOLAM HCL 10 MG/2ML IJ SOLN
INTRAMUSCULAR | Status: DC | PRN
  Administered 2013-04-20 (×4): 2 mg via INTRAVENOUS

## 2013-04-20 MED ORDER — SODIUM CHLORIDE 0.9 % IV SOLN: INTRAVENOUS | Status: DC

## 2013-04-20 NOTE — Progress Notes (Signed)
Pt c/o left sided chest tightness after FFP was started; Dr. Mahala Menghini rounding at the time; EKG, cardiac enzymes ordered;FFP stopped.  Dr. Elnoria Howard notified of situation and FFP was discontinued per Dr. Elnoria Howard.  Blood bank made aware of situation.

## 2013-04-20 NOTE — Progress Notes (Signed)
Jeff Barrett:[id number] DOB: [birth year] DOA: 04/18/2013 PCP: Thayer Headings, MD  Brief narrative: 79 yr old ? former [employer] vet , known h/o hypertension, PAF c Elevated CHADS2Vasc2 score on Xarelto, s/p Cardionet for Tachy-brady hyperlipidemia, mild aortic stenosis and chronic left lower extremity edema, with h/o Appendectomy 02/07/2009 by Dr. Dwain Sarna presented 9/17 with Acute abd pain. [place] Abd=Acute cholecystitis-LFTS markedly elevated, have trended down.  Nursing concerns about borderline hypotension, ? Hypoxia while sleeping.  Cardiology consulted by Gen surg regarding clearance and clearance obtained for cholecystectomy Gi consulted given elevated Bilbi, and MRCP 9/18 neg Was about to go to ERCP when had Chest tightness, probably related to transfusion of FFP  Past medical history-As per Problem list  Consultants:  Gen Surgery-[facility]  Cardiology-[facility]  [facility]  Procedures:  US abdomen  MRCP 9/18  Antibiotics:  Ciprofloxacin 9/17  Unasyn 9/18   Subjective  Having some mild chest pain this morning that started at 9:30. States that it is left-sided chest radiation to the right lower ribs. Of note patient was on FFP which started at approximately the same time. Character of pain is just a tightness, there is no crushing in nature there is no dullness there is no radiation he rates it as mild    Objective    Interim History: None   Telemetry: NSR per my auscultation  Objective: Filed Vitals:   04/19/13 2137 04/20/13 0546 04/20/13 0900 04/20/13 0945  BP: 123/66 135/69 135/67 144/68  Pulse: 53 52 54 54  Temp: 97.9 F (36.6 C) 97.7 F (36.5 C) 97.6 F (36.4 C) 97.8 F (36.6 C)  TempSrc: Oral Oral Oral Oral  Resp: 18 18 16 17   Height:      Weight:      SpO2: 95% 93% 94%     Intake/Output Summary (Last 24 hours) at 04/20/13 1025 Last data filed at 04/20/13 0930  Gross per 24 hour  Intake 1501.25 ml  Output   1252 ml  Net 249.25 ml     Exam:  General: alert ,pleasant oriente din NAD Cardiovascular: s1 s2 NSR RRR Respiratory: clear Abdomen: soft, NT,ND Skin soft, Trace edema Neuro ROM intact  Data Reviewed: Basic Metabolic Panel:  Recent Labs Lab 04/18/13 0908 04/19/13 0420 04/20/13 0418  NA 136 137 138  K 3.9 4.1 3.9  CL 100 103 105  CO2 27 29 28   GLUCOSE 150* 79 90  BUN 20 20 20   CREATININE 1.33 1.46* 1.57*  CALCIUM 9.1 8.7 8.4   Liver Function Tests:  Recent Labs Lab 04/18/13 0908 04/19/13 0420 04/20/13 0418  AST 1009* 580* 287*  ALT 450* 385* 245*  ALKPHOS 191* 187* 170*  BILITOT 1.2 2.1* 1.0  PROT 7.1 6.0 5.7*  ALBUMIN 2.9* 2.3* 2.1*    Recent Labs Lab 04/18/13 0908 04/19/13 0420  LIPASE 36 15   No results found for this basename: AMMONIA,  in the last 168 hours CBC:  Recent Labs Lab 04/18/13 0908 04/19/13 0420 04/20/13 0418  WBC 9.7 8.1 7.1  NEUTROABS 8.1*  --   --   HGB 11.1* 9.9* 10.0*  HCT 31.5* 29.8* 29.2*  MCV 95.5 97.4 96.4  PLT 191 166 168   Cardiac Enzymes:  Recent Labs Lab 04/18/13 0908  TROPONINI <0.30   BNP: No components found with this basename: POCBNP,  CBG:  Recent Labs Lab 04/19/13 0812  GLUCAP 79    Recent Results (from the past 240 hour(s))  URINE CULTURE     Status: None  Collection Time    04/18/13 10:23 AM      Result Value Range Status   Specimen Description URINE, CLEAN CATCH   Final   Special Requests NONE   Final   Culture  Setup Time     Final   Value: 04/18/2013 16:19     Performed at [facility] Count     Final   Value: NO GROWTH     Performed at [facility]   Culture     Final   Value: NO GROWTH     Performed at [facility]   Report Status 04/19/2013 FINAL   Final  SURGICAL PCR SCREEN     Status: None   Collection Time    04/20/13  4:42 AM      Result Value Range Status   MRSA, PCR NEGATIVE  NEGATIVE Final   Staphylococcus aureus NEGATIVE  NEGATIVE Final   Comment:             The Xpert SA Assay (FDA     approved for NASAL specimens     in patients over 87 years of age),     is one component of     a comprehensive surveillance     program.  Test performance has     been validated by [employer] for patients greater     than or equal to 51 year old.     It is not intended     to diagnose infection nor to     guide or monitor treatment.     Studies:              All Imaging reviewed and is as per above notation   Scheduled Meds: . ampicillin-sulbactam (UNASYN) IV  3 g Intravenous Q8H  . atorvastatin  20 mg Oral Daily  . cholecalciferol  1,000 Units Oral Daily  . diltiazem  120 mg Oral Daily  . doxazosin  2 mg Oral QHS  . metoprolol succinate  25 mg Oral q morning - 10a  . pantoprazole  40 mg Oral Daily   Continuous Infusions: . dextrose 5 % and 0.9 % NaCl with KCl 20 mEq/L 75 mL/hr at 04/20/13 2440     Assessment/Plan: 1. Acute Cholecystitis-General surgery kindly address  Antibiotics  Timing of surgery  Peri-op pain management/fluids  Resumption Anti-coagulation  Patient underwent MRCP by Dr. Elnoria Howard, and was scheduled for ERCP 9/19-patient given FFP but this had to be stopped because of potential transfusion reaction  1. Hypotension-iatrogenic, 2/2 to B-blocker, Cardizem + Cardura-Discussed with Corine Shelter of [facility]-recommends cutting Cardizem 240-->120mg  daily.  Blood pressure now better in 120-140 systolic range 2. Chest tightness -patient was receiving FFP at the time. Potentially transfusion reaction however with his known atrial fibrillation , will get an EKG and a troponin and transfer to telemetry for slightly closer monitoring-We will [facility] cardiology aware of this change 3. Hypoglycemia-continue D5-saline 75 cc till surgery 4. H/o PAF-currently in NSR-note that patient has a slightly elevated INR 9/19 and 1.5 which is a prompted VitK 10 milligrams as well as FFP 5. BPH-cut back cardura dose from 4mg -->2mg  6. HLD-Continue  Atorvastatin peri-op based on observational data this is cardio-protective 7. Unlikely UTI-Continue Unasyn for Intra-abdominal process [cholecystitis] is a will hold all oral he was.  His urine was "clean catch" and I am not convinced he has pyelonephritis  Code Status: Full Family Communication: none at bedside Disposition Plan:  Inpatient    Pleas Koch, MD  [facility] Pager [phone] 04/20/2013, 10:25 AM    LOS: 2 days

## 2013-04-20 NOTE — Progress Notes (Signed)
Report called to [facility], RN for transfer to Rm 1425.

## 2013-04-20 NOTE — Interval H&P Note (Signed)
History and Physical Interval Note:  04/20/2013 2:58 PM  Jeff Barrett  has presented today for surgery, with the diagnosis of CBD stones  The various methods of treatment have been discussed with the patient and family. After consideration of risks, benefits and other options for treatment, the patient has consented to  Procedure(s): ENDOSCOPIC RETROGRADE CHOLANGIOPANCREATOGRAPHY (ERCP) (N/A) as a surgical intervention .  The patient's history has been reviewed, patient examined, no change in status, stable for surgery.  I have reviewed the patient's chart and labs.  Questions were answered to the patient's satisfaction.     [name]

## 2013-04-20 NOTE — Progress Notes (Signed)
Seen, examined, agree with above.   Ercp today, Lap chole tomorrow.

## 2013-04-20 NOTE — Progress Notes (Signed)
Patient transferred from the 5th floor to 1425, patient denies any pain/distress. Reviewed plan of care with patient, oriented patient to room/unit. Heart rate in the 40s with occasional 39. Notified Sherre Poot and he added some parameters to patient's metoprolol and Cardizem.

## 2013-04-20 NOTE — Progress Notes (Signed)
PT Cancellation Note  Patient Details Name: Jeff Barrett MRN: [id number] DOB: [birth year]   Cancelled Treatment:    Reason Eval/Treat Not Completed: Patient not medically ready- cardiology consulting with pt to clear for surgery later today. Will hold PT eval today and check back on tomorrow. Thanks.    Rebeca Alert, MPT Pager: [phone]

## 2013-04-20 NOTE — Progress Notes (Signed)
Patient back from endo for ERCP, still sedated, no distress noted, family at bedside. Patient continue to be bradycardia on the the heart monitor heart rate in the 30s. Notified Dr. Mahala Menghini who was with Dr. Herbie Baltimore at that moment and informed him as well, stated they will be make some changes to medication, to continue to monitor patient. Will continue to assess patient.

## 2013-04-20 NOTE — Progress Notes (Signed)
Subjective:  Called to see by pt before he goes for ERCP.  Pt had "chest pain" earlier. He describes bilateral chest discomfort that sounds M/S. He says it started after he had his MRI last night. He has no history of CAD. EKG is without acute changes. He had a negative Myoview June 2011 and an echo 6/14 that suggested grade 2 diastolic dysfunction with NL LVF. He is having no pain now. No radiation to his arms or jaw. No associated SOB or diaphoresis.  Objective:  Vital Signs in the last 24 hours: Temp:  [97.5 F (36.4 C)-98.1 F (36.7 C)] 97.5 F (36.4 C) (09/19 1124) Pulse Rate:  [48-54] 51 (09/19 1124) Resp:  [16-18] 16 (09/19 1124) BP: (92-144)/(49-69) 137/61 mmHg (09/19 1124) SpO2:  [93 %-99 %] 98 % (09/19 1124) Weight:  [150 lb (68.04 kg)] 150 lb (68.04 kg) (09/19 1124)  Intake/Output from previous day:  Intake/Output Summary (Last 24 hours) at 04/20/13 1307 Last data filed at 04/20/13 0945  Gross per 24 hour  Intake 1506.25 ml  Output   1251 ml  Net 255.25 ml    Physical Exam: General appearance: alert, cooperative and no distress Lungs: clear to auscultation bilaterally Heart: regular rate and rhythm   Rate: 60  Rhythm: normal sinus rhythm and sinus bradycardia  Lab Results:  Recent Labs  04/19/13 0420 04/20/13 0418  WBC 8.1 7.1  HGB 9.9* 10.0*  PLT 166 168    Recent Labs  04/19/13 0420 04/20/13 0418  NA 137 138  K 4.1 3.9  CL 103 105  CO2 29 28  GLUCOSE 79 90  BUN 20 20  CREATININE 1.46* 1.57*    Recent Labs  04/18/13 0908 04/20/13 1135  TROPONINI <0.30 <0.30    Recent Labs  04/20/13 0418  INR 1.52*    Imaging: Imaging results have been reviewed  Cardiac Studies: EKG- NSR/SB no acute changes  Assessment/Plan:   Principal Problem:   Cholecystitis, acute Active Problems:   Paroxysmal atrial fibrillation   Chronic anticoagulation- Xarelto for PAF   Chronic renal insufficiency, stage III (moderate)   Essential hypertension  Hyperlipidemia   Aortic sclerosis   Diastolic dysfunction- grade 2    PLAN:  Dr Harding to see. Keep NPO for possible ERCP later today. He has been noted to be bradycardic when sleeping by RN. His Diltiazem has already been cut in half. We may need to stop Diltiazem altogether. Hold parameters written for Diltiazem and Toprol. Would not stop Toprol if at all possible.    Luke Kilroy PA-C Beeper [phone] 04/20/2013, 1:07 PM   I have seen & examined the patient.  I have reviewed his chart & discussed his Sx.   R sided chest wall (posterior) discomfort-- based upon his description & physical exam demonstrating reproducible discomfort. I believe that this is MSK related pain & should not preclude him proceeding with ECRP.  He has had a negative cardiac ischemic evaluation for Afib in the recent history.  No active cardiac issues.   We will monitor & be available to assist if needed  HARDING,DAVID W, MD  

## 2013-04-20 NOTE — Op Note (Signed)
[facility] [address] [place], [address]   ERCP PROCEDURE REPORT  PATIENT: Jeff Barrett, Jeff Barrett.  MR# :[id number] BIRTHDATE: March 20, 1921  GENDER: Male ENDOSCOPIST: Jeani Hawking, MD REFERRED BY: PROCEDURE DATE:  04/20/2013 PROCEDURE:   ERCP with removal of calculus/calculi ASA CLASS:   Class III INDICATIONS:Choledocholithiasis. MEDICATIONS: Fentanyl 100 mcg IV and Versed 8 mg IV TOPICAL ANESTHETIC: Cetacaine Spray  DESCRIPTION OF PROCEDURE:   After the risks benefits and alternatives of the procedure were thoroughly explained, informed consent was obtained.  The Pentax ERCP [id number]  endoscope was introduced through the mouth  and advanced to the second portion of the duodenum .  The CBD was cannulated with mild difficulty.  The guidewire was secured in the left intrahepatic duct.  Contrast injection revealed a dilated CBD at approximately 1 cm with a large distal filling defect.  A 1 cm sphincterotomy was created and with multiple sweeps the stone was removed in fragments.  In fact, there was evidence of three pigmented stones clumped together.  Two occlusion cholangiograms were performed, which were negative for any retained stones.   The scope was then completely withdrawn from the patient and the procedure terminated.     COMPLICATIONS:  ENDOSCOPIC IMPRESSION: 1) Choledocholithiasis s/p successful extraction.  RECOMMENDATIONS: 1) Lap chole per Surgery.    _______________________________ eSignedJeani Hawking, MD 04/20/2013 3:58 PM   CC:

## 2013-04-20 NOTE — H&P (View-Only) (Signed)
Subjective:  Called to see by pt before he goes for ERCP.  Pt had "chest pain" earlier. He describes bilateral chest discomfort that sounds M/S. He says it started after he had his MRI last night. He has no history of CAD. EKG is without acute changes. He had a negative Myoview June 2011 and an echo 6/14 that suggested grade 2 diastolic dysfunction with NL LVF. He is having no pain now. No radiation to his arms or jaw. No associated SOB or diaphoresis.  Objective:  Vital Signs in the last 24 hours: Temp:  [97.5 F (36.4 C)-98.1 F (36.7 C)] 97.5 F (36.4 C) (09/19 1124) Pulse Rate:  [48-54] 51 (09/19 1124) Resp:  [16-18] 16 (09/19 1124) BP: (92-144)/(49-69) 137/61 mmHg (09/19 1124) SpO2:  [93 %-99 %] 98 % (09/19 1124) Weight:  [150 lb (68.04 kg)] 150 lb (68.04 kg) (09/19 1124)  Intake/Output from previous day:  Intake/Output Summary (Last 24 hours) at 04/20/13 1307 Last data filed at 04/20/13 0945  Gross per 24 hour  Intake 1506.25 ml  Output   1251 ml  Net 255.25 ml    Physical Exam: General appearance: alert, cooperative and no distress Lungs: clear to auscultation bilaterally Heart: regular rate and rhythm   Rate: 60  Rhythm: normal sinus rhythm and sinus bradycardia  Lab Results:  Recent Labs  04/19/13 0420 04/20/13 0418  WBC 8.1 7.1  HGB 9.9* 10.0*  PLT 166 168    Recent Labs  04/19/13 0420 04/20/13 0418  NA 137 138  K 4.1 3.9  CL 103 105  CO2 29 28  GLUCOSE 79 90  BUN 20 20  CREATININE 1.46* 1.57*    Recent Labs  04/18/13 0908 04/20/13 1135  TROPONINI <0.30 <0.30    Recent Labs  04/20/13 0418  INR 1.52*    Imaging: Imaging results have been reviewed  Cardiac Studies: EKG- NSR/SB no acute changes  Assessment/Plan:   Principal Problem:   Cholecystitis, acute Active Problems:   Paroxysmal atrial fibrillation   Chronic anticoagulation- Xarelto for PAF   Chronic renal insufficiency, stage III (moderate)   Essential hypertension  Hyperlipidemia   Aortic sclerosis   Diastolic dysfunction- grade 2    PLAN:  Dr Herbie Baltimore to see. Keep NPO for possible ERCP later today. He has been noted to be bradycardic when sleeping by RN. His Diltiazem has already been cut in half. We may need to stop Diltiazem altogether. Hold parameters written for Diltiazem and Toprol. Would not stop Toprol if at all possible.    Corine Shelter PA-C Beeper [phone] 04/20/2013, 1:07 PM   I have seen & examined the patient.  I have reviewed his chart & discussed his Sx.   R sided chest wall (posterior) discomfort-- based upon his description & physical exam demonstrating reproducible discomfort. I believe that this is MSK related pain & should not preclude him proceeding with ECRP.  He has had a negative cardiac ischemic evaluation for Afib in the recent history.  No active cardiac issues.   We will monitor & be available to assist if needed  Marykay Lex, MD

## 2013-04-20 NOTE — Progress Notes (Signed)
Subjective: Pt feeling better today, denies dizziness, weakness, sob, CP.  Denies abdominal pain, n/v/d.  Denies dysuria.  Blood pressure has improved.  Objective: Vital signs in last 24 hours: Temp:  [97.7 F (36.5 C)-98.1 F (36.7 C)] 97.7 F (36.5 C) (09/19 0546) Pulse Rate:  [50-53] 52 (09/19 0546) Resp:  [18] 18 (09/19 0546) BP: (92-135)/(42-69) 135/69 mmHg (09/19 0546) SpO2:  [93 %-95 %] 93 % (09/19 0546) Last BM Date: 04/17/13  Intake/Output from previous day: 09/18 0701 - 09/19 0700 In: 1968.8 [P.O.:600; I.V.:1368.8] Out: 1402 [Urine:1402] Intake/Output this shift:    PE  General appearance: alert, cooperative, appears stated age and no distress  Resp: clear to auscultation bilaterally  Cardio: regular rate and rhythm, S1, S2 normal, no murmur, click, rub or gallop  GI: +bs.  Abdomen is soft, flat and non tender. No evidence of peritonitis.  Extremities: extremities normal, atraumatic, no cyanosis or edema   Lab Results:   Recent Labs  04/19/13 0420 04/20/13 0418  WBC 8.1 7.1  HGB 9.9* 10.0*  HCT 29.8* 29.2*  PLT 166 168   BMET  Recent Labs  04/19/13 0420 04/20/13 0418  NA 137 138  K 4.1 3.9  CL 103 105  CO2 29 28  GLUCOSE 79 90  BUN 20 20  CREATININE 1.46* 1.57*  CALCIUM 8.7 8.4   PT/INR  Recent Labs  04/19/13 0420 04/20/13 0418  LABPROT 22.9* 17.9*  INR 2.10* 1.52*    Studies/Results: US Abdomen Complete  04/18/2013   *RADIOLOGY REPORT*  Clinical Data:  The small bowel abdominal pain and elevated liver function tests.  COMPLETE ABDOMINAL ULTRASOUND  Comparison:  CT scan of the abdomen dated 02/07/2009  Findings:  Gallbladder:  The gallbladder is quite distended with the thickening of the gallbladderwall and pericholecystic fluid as well as sludge and stones in the gallbladder.  Findings are consistent with acute cholecystitis.  However, the patient has a negative sonographic Murphy's sign.  Common bile duct:  Common bile duct is 7.4 mm  in diameter.  There is slight intrahepatic ductal dilatation.  No visible common bile duct stones.  Liver:  Normal slightly dilated intrahepatic ducts.  No focal mass lesions.  IVC:  Normal.  Pancreas:  The pancreas is normal except for dilatation of the pancreatic duct to 3.8 mm.  Spleen:  Normal.  And 7.2 cm in length.  Right Kidney:  10.9 cm in length.  Multiple renal calculi.  Left Kidney:  Normal.  9.0 cm in length.  Abdominal aorta:  Maximum diameter is approximately 3 cm at a focal bulge in the mid abdominal aorta.  IMPRESSION:  1.  Cholelithiasis with edematous gallbladder wall and pericholecystic fluid consistent with cholecystitis. 2.  Slightly dilated intra and extrahepatic bile ducts and dilated pancreatic duct consistent with distal common bile duct obstruction.  No discrete stone is identified on this exam however. 3.  Multiple right renal calculi. 4.  Focal dilatation of the abdominal aorta to a diameter of 3 cm.   Original Report Authenticated By: Francene Boyers, M.D.    Anti-infectives: Anti-infectives   Start     Dose/Rate Route Frequency Ordered Stop   04/19/13 1000  Ampicillin-Sulbactam (UNASYN) 3 g in sodium chloride 0.9 % 100 mL IVPB     3 g 100 mL/hr over 60 Minutes Intravenous Every 8 hours 04/19/13 0911     04/18/13 1600  ciprofloxacin (CIPRO) IVPB 400 mg  Status:  Discontinued     400 mg 200 mL/hr over 60  Minutes Intravenous Every 12 hours 04/18/13 1433 04/19/13 0911   04/18/13 1145  piperacillin-tazobactam (ZOSYN) IVPB 3.375 g     3.375 g 100 mL/hr over 30 Minutes Intravenous  Once 04/18/13 1141 04/18/13 1337      Assessment/Plan: Diastolic CHF HTN Acute on chronic kidney disease  Paroxysmal AF  Acute Cholecystitis, cholelithiasis  -Cipro 9/17, change to Unasyn 9/18 -NPO x for meds -cleared by cardiology, appreciate consult  -pain control  -continue with IVF  -MRCP completed, not read.  LFTs are trending down with normal bilirubin.  Cholecystectomy versus ERCP  today pending MRCP results.  GI following, appreciate consult. -consent signed -INR 1.5, high bleeding risk.  Transfuse 1unit of FFP now. -VTE prophylaxis; SCDs, mobilize.  Anticoagulation post operatively per cards and primary team.    LOS: 2 days    Bonner Puna [facility] ANP-BC Pager [phone]  04/20/2013 8:02 AM

## 2013-04-21 ENCOUNTER — Encounter (HOSPITAL_COMMUNITY)
Admission: EM | Disposition: A | Discharge status: Home / Self Care | Payer: Self-pay | Source: Home / Self Care | Attending: Family Medicine

## 2013-04-21 ENCOUNTER — Inpatient Hospital Stay (HOSPITAL_COMMUNITY): Payer: Medicare Other | Admitting: Anesthesiology

## 2013-04-21 ENCOUNTER — Encounter (HOSPITAL_COMMUNITY): Payer: Self-pay | Admitting: Anesthesiology

## 2013-04-21 ENCOUNTER — Inpatient Hospital Stay (HOSPITAL_COMMUNITY): Payer: Medicare Other

## 2013-04-21 DIAGNOSIS — K811 Chronic cholecystitis: Secondary | ICD-10-CM

## 2013-04-21 HISTORY — PX: CHOLECYSTECTOMY: SHX55

## 2013-04-21 LAB — CBC
MCV: 95.9 fL (ref 78.0–100.0)
Platelets: 209 10*3/uL (ref 150–400)
RDW: 14.2 % (ref 11.5–15.5)
WBC: 9.7 10*3/uL (ref 4.0–10.5)

## 2013-04-21 LAB — GLUCOSE, CAPILLARY
Glucose-Capillary: 109 mg/dL — ABNORMAL HIGH (ref 70–99)
Glucose-Capillary: 105 mg/dL — ABNORMAL HIGH (ref 70–99)
Glucose-Capillary: 108 mg/dL — ABNORMAL HIGH (ref 70–99)
Glucose-Capillary: 141 mg/dL — ABNORMAL HIGH (ref 70–99)

## 2013-04-21 LAB — COMPREHENSIVE METABOLIC PANEL
Albumin: 2.1 g/dL — ABNORMAL LOW (ref 3.5–5.2)
Alkaline Phosphatase: 245 U/L — ABNORMAL HIGH (ref 39–117)
BUN: 12 mg/dL (ref 6–23)
Potassium: 3.8 mEq/L (ref 3.5–5.1)
Total Protein: 5.8 g/dL — ABNORMAL LOW (ref 6.0–8.3)

## 2013-04-21 LAB — PROTIME-INR
INR: 1.17 (ref 0.00–1.49)
Prothrombin Time: 14.7 seconds (ref 11.6–15.2)

## 2013-04-21 SURGERY — LAPAROSCOPIC CHOLECYSTECTOMY WITH INTRAOPERATIVE CHOLANGIOGRAM
Anesthesia: General | Site: Abdomen | Wound class: Contaminated

## 2013-04-21 MED ORDER — IPRATROPIUM-ALBUTEROL 20-100 MCG/ACT IN AERS
1.0000 | INHALATION_SPRAY | Freq: Four times a day (QID) | RESPIRATORY_TRACT | Status: DC
  Administered 2013-04-21 (×2): 1 via RESPIRATORY_TRACT
  Filled 2013-04-21 (×2): qty 4

## 2013-04-21 MED ORDER — ACETAMINOPHEN 10 MG/ML IV SOLN
1000.0000 mg | Freq: Once | INTRAVENOUS | Status: AC
  Administered 2013-04-21: 10:00:00 1000 mg via INTRAVENOUS
  Filled 2013-04-21: qty 100

## 2013-04-21 MED ORDER — NEOSTIGMINE METHYLSULFATE 1 MG/ML IJ SOLN
INTRAMUSCULAR | Status: DC | PRN
  Administered 2013-04-21: 3 mg via INTRAVENOUS

## 2013-04-21 MED ORDER — MEPERIDINE HCL 50 MG/ML IJ SOLN: 6.2500 mg | INTRAMUSCULAR | Status: DC | PRN

## 2013-04-21 MED ORDER — SODIUM CHLORIDE 0.9 % IV SOLN
3.0000 g | Freq: Three times a day (TID) | INTRAVENOUS | Status: AC
  Administered 2013-04-21 – 2013-04-22 (×3): 3 g via INTRAVENOUS
  Filled 2013-04-21 (×3): qty 3

## 2013-04-21 MED ORDER — LIP MEDEX EX OINT
1.0000 "application " | TOPICAL_OINTMENT | Freq: Two times a day (BID) | CUTANEOUS | Status: DC
  Administered 2013-04-21 – 2013-04-24 (×7): 1 via TOPICAL
  Filled 2013-04-21: qty 7

## 2013-04-21 MED ORDER — ZOLPIDEM TARTRATE 5 MG PO TABS: 5.0000 mg | ORAL_TABLET | Freq: Every evening | ORAL | Status: DC | PRN

## 2013-04-21 MED ORDER — FLUNISOLIDE 25 MCG/ACT (0.025%) NA SOLN
2.0000 | Freq: Two times a day (BID) | NASAL | Status: DC
  Administered 2013-04-21 – 2013-04-24 (×7): 2 via NASAL
  Filled 2013-04-21: qty 25

## 2013-04-21 MED ORDER — PSYLLIUM 95 % PO PACK
1.0000 | PACK | Freq: Two times a day (BID) | ORAL | Status: DC
  Administered 2013-04-21 – 2013-04-24 (×7): 1 via ORAL
  Filled 2013-04-21 (×8): qty 1

## 2013-04-21 MED ORDER — PROMETHAZINE HCL 25 MG/ML IJ SOLN
12.5000 mg | Freq: Four times a day (QID) | INTRAMUSCULAR | Status: DC | PRN
Start: 2013-04-21 — End: 2013-04-21
  Administered 2013-04-21: 14:00:00 25 mg via INTRAVENOUS
  Filled 2013-04-21: qty 1

## 2013-04-21 MED ORDER — FENTANYL CITRATE 0.05 MG/ML IJ SOLN: 25.0000 ug | INTRAMUSCULAR | Status: DC | PRN

## 2013-04-21 MED ORDER — ACETAMINOPHEN 500 MG PO TABS
1000.0000 mg | ORAL_TABLET | Freq: Three times a day (TID) | ORAL | Status: DC
  Administered 2013-04-21: 1000 mg via ORAL
  Filled 2013-04-21: qty 2

## 2013-04-21 MED ORDER — BUPIVACAINE-EPINEPHRINE PF 0.25-1:200000 % IJ SOLN
INTRAMUSCULAR | Status: AC
  Filled 2013-04-21: qty 30

## 2013-04-21 MED ORDER — MAGNESIUM HYDROXIDE 400 MG/5ML PO SUSP: 30.0000 mL | Freq: Two times a day (BID) | ORAL | Status: DC | PRN

## 2013-04-21 MED ORDER — BISACODYL 10 MG RE SUPP
10.0000 mg | Freq: Two times a day (BID) | RECTAL | Status: DC | PRN
  Administered 2013-04-24: 09:00:00 10 mg via RECTAL
  Filled 2013-04-21: qty 1

## 2013-04-21 MED ORDER — ESMOLOL HCL 10 MG/ML IV SOLN
INTRAVENOUS | Status: DC | PRN
  Administered 2013-04-21: 15 mg via INTRAVENOUS
  Administered 2013-04-21: 20 mg via INTRAVENOUS

## 2013-04-21 MED ORDER — GLYCOPYRROLATE 0.2 MG/ML IJ SOLN
INTRAMUSCULAR | Status: DC | PRN
  Administered 2013-04-21: 0.4 mg via INTRAVENOUS

## 2013-04-21 MED ORDER — PHENYLEPHRINE HCL 10 MG/ML IJ SOLN
INTRAMUSCULAR | Status: DC | PRN
  Administered 2013-04-21 (×2): 40 ug via INTRAVENOUS
  Administered 2013-04-21 (×2): 20 ug via INTRAVENOUS

## 2013-04-21 MED ORDER — SACCHAROMYCES BOULARDII 250 MG PO CAPS
250.0000 mg | ORAL_CAPSULE | Freq: Two times a day (BID) | ORAL | Status: DC
  Administered 2013-04-21 – 2013-04-24 (×7): 250 mg via ORAL
  Filled 2013-04-21 (×8): qty 1

## 2013-04-21 MED ORDER — ONE-DAILY MULTI VITAMINS PO TABS: 1.0000 | ORAL_TABLET | Freq: Every day | ORAL | Status: DC

## 2013-04-21 MED ORDER — SODIUM CHLORIDE 0.9 % IJ SOLN
3.0000 mL | Freq: Two times a day (BID) | INTRAMUSCULAR | Status: DC
  Administered 2013-04-22 – 2013-04-23 (×4): 3 mL via INTRAVENOUS

## 2013-04-21 MED ORDER — ALUM & MAG HYDROXIDE-SIMETH 200-200-20 MG/5ML PO SUSP: 30.0000 mL | Freq: Four times a day (QID) | ORAL | Status: DC | PRN

## 2013-04-21 MED ORDER — ONDANSETRON HCL 4 MG/2ML IJ SOLN
INTRAMUSCULAR | Status: DC | PRN
  Administered 2013-04-21 (×2): 2 mg via INTRAVENOUS

## 2013-04-21 MED ORDER — TAMSULOSIN HCL 0.4 MG PO CAPS
0.4000 mg | ORAL_CAPSULE | Freq: Every day | ORAL | Status: DC
  Administered 2013-04-21 – 2013-04-22 (×2): 0.4 mg via ORAL
  Filled 2013-04-21 (×3): qty 1

## 2013-04-21 MED ORDER — PROPOFOL 10 MG/ML IV BOLUS
INTRAVENOUS | Status: DC | PRN
  Administered 2013-04-21: 100 mg via INTRAVENOUS
  Administered 2013-04-21: 20 mg via INTRAVENOUS

## 2013-04-21 MED ORDER — KETOROLAC TROMETHAMINE 30 MG/ML IJ SOLN: 15.0000 mg | Freq: Once | INTRAMUSCULAR | Status: DC | PRN

## 2013-04-21 MED ORDER — BUPIVACAINE-EPINEPHRINE 0.25% -1:200000 IJ SOLN
INTRAMUSCULAR | Status: DC | PRN
  Administered 2013-04-21: 08:00:00 50 mL

## 2013-04-21 MED ORDER — FUROSEMIDE 20 MG PO TABS
20.0000 mg | ORAL_TABLET | Freq: Every day | ORAL | Status: DC
  Administered 2013-04-21 – 2013-04-23 (×3): 20 mg via ORAL
  Filled 2013-04-21 (×4): qty 1

## 2013-04-21 MED ORDER — TRAMADOL HCL 50 MG PO TABS
50.0000 mg | ORAL_TABLET | Freq: Four times a day (QID) | ORAL | Status: DC | PRN
Start: 2013-04-21 — End: 2014-07-08

## 2013-04-21 MED ORDER — MAGIC MOUTHWASH
15.0000 mL | Freq: Four times a day (QID) | ORAL | Status: DC | PRN
  Filled 2013-04-21: qty 15

## 2013-04-21 MED ORDER — SODIUM CHLORIDE 0.9 % IJ SOLN: 3.0000 mL | INTRAMUSCULAR | Status: DC | PRN

## 2013-04-21 MED ORDER — TRAMADOL HCL 50 MG PO TABS
50.0000 mg | ORAL_TABLET | Freq: Four times a day (QID) | ORAL | Status: DC | PRN
  Administered 2013-04-21 – 2013-04-22 (×3): 50 mg via ORAL
  Filled 2013-04-21 (×3): qty 1

## 2013-04-21 MED ORDER — SODIUM CHLORIDE 0.9 % IV SOLN
INTRAVENOUS | Status: DC | PRN
  Administered 2013-04-21: 08:00:00 via INTRAVENOUS

## 2013-04-21 MED ORDER — LACTATED RINGERS IV BOLUS (SEPSIS): 1000.0000 mL | Freq: Three times a day (TID) | INTRAVENOUS | Status: AC | PRN

## 2013-04-21 MED ORDER — KETAMINE HCL 10 MG/ML IJ SOLN
INTRAMUSCULAR | Status: DC | PRN
  Administered 2013-04-21: 10 mg via INTRAVENOUS

## 2013-04-21 MED ORDER — ADULT MULTIVITAMIN W/MINERALS CH
1.0000 | ORAL_TABLET | Freq: Every day | ORAL | Status: DC
  Administered 2013-04-21 – 2013-04-24 (×4): 1 via ORAL
  Filled 2013-04-21 (×4): qty 1

## 2013-04-21 MED ORDER — RIVAROXABAN 15 MG PO TABS
15.0000 mg | ORAL_TABLET | Freq: Every day | ORAL | Status: DC
  Administered 2013-04-23 – 2013-04-24 (×2): 15 mg via ORAL
  Filled 2013-04-21 (×2): qty 1

## 2013-04-21 MED ORDER — IOHEXOL 300 MG/ML  SOLN
INTRAMUSCULAR | Status: DC | PRN
  Administered 2013-04-21: 09:00:00 20 mL via INTRAVENOUS

## 2013-04-21 MED ORDER — CISATRACURIUM BESYLATE (PF) 10 MG/5ML IV SOLN
INTRAVENOUS | Status: DC | PRN
  Administered 2013-04-21: 5 mg via INTRAVENOUS

## 2013-04-21 MED ORDER — PROMETHAZINE HCL 25 MG/ML IJ SOLN: 12.5000 mg | Freq: Three times a day (TID) | INTRAMUSCULAR | Status: DC | PRN

## 2013-04-21 MED ORDER — LACTATED RINGERS IR SOLN
Status: DC | PRN
  Administered 2013-04-21: 1

## 2013-04-21 MED ORDER — FENTANYL CITRATE 0.05 MG/ML IJ SOLN
INTRAMUSCULAR | Status: DC | PRN
  Administered 2013-04-21: 25 ug via INTRAVENOUS
  Administered 2013-04-21 (×2): 50 ug via INTRAVENOUS
  Administered 2013-04-21: 25 ug via INTRAVENOUS
  Administered 2013-04-21: 50 ug via INTRAVENOUS

## 2013-04-21 MED ORDER — ONDANSETRON HCL 4 MG/2ML IJ SOLN: 4.0000 mg | Freq: Once | INTRAMUSCULAR | Status: DC | PRN

## 2013-04-21 MED ORDER — SUCCINYLCHOLINE CHLORIDE 20 MG/ML IJ SOLN
INTRAMUSCULAR | Status: DC | PRN
  Administered 2013-04-21: 100 mg via INTRAVENOUS

## 2013-04-21 MED ORDER — AMPICILLIN-SULBACTAM SODIUM 3 (2-1) G IV SOLR: 3.0000 g | Freq: Once | INTRAVENOUS | Status: DC

## 2013-04-21 SURGICAL SUPPLY — 38 items
APPLIER CLIP 5 13 M/L LIGAMAX5 (MISCELLANEOUS) ×2
APPLIER CLIP ROT 10 11.4 M/L (STAPLE)
CABLE HIGH FREQUENCY MONO STRZ (ELECTRODE) ×2 IMPLANT
CANISTER SUCTION 2500CC (MISCELLANEOUS) ×2 IMPLANT
CLIP APPLIE 5 13 M/L LIGAMAX5 (MISCELLANEOUS) ×1 IMPLANT
CLIP APPLIE ROT 10 11.4 M/L (STAPLE) IMPLANT
CLOTH BEACON ORANGE TIMEOUT ST (SAFETY) ×2 IMPLANT
COVER MAYO STAND STRL (DRAPES) ×2 IMPLANT
DECANTER SPIKE VIAL GLASS SM (MISCELLANEOUS) ×2 IMPLANT
DRAPE C-ARM 42X120 X-RAY (DRAPES) ×2 IMPLANT
DRAPE LAPAROSCOPIC ABDOMINAL (DRAPES) ×2 IMPLANT
DRAPE UTILITY XL STRL (DRAPES) ×2 IMPLANT
DRAPE WARM FLUID 44X44 (DRAPE) ×2 IMPLANT
DRSG TEGADERM 2-3/8X2-3/4 SM (GAUZE/BANDAGES/DRESSINGS) ×2 IMPLANT
DRSG TEGADERM 4X4.75 (GAUZE/BANDAGES/DRESSINGS) ×2 IMPLANT
ELECT REM PT RETURN 9FT ADLT (ELECTROSURGICAL) ×2
ELECTRODE REM PT RTRN 9FT ADLT (ELECTROSURGICAL) ×1 IMPLANT
ENDOLOOP SUT PDS II  0 18 (SUTURE) ×1
ENDOLOOP SUT PDS II 0 18 (SUTURE) ×1 IMPLANT
GAUZE SPONGE 2X2 8PLY STRL LF (GAUZE/BANDAGES/DRESSINGS) ×1 IMPLANT
GLOVE ECLIPSE 8.0 STRL XLNG CF (GLOVE) ×2 IMPLANT
GLOVE INDICATOR 8.0 STRL GRN (GLOVE) ×2 IMPLANT
GOWN STRL REIN XL XLG (GOWN DISPOSABLE) ×4 IMPLANT
KIT BASIN OR (CUSTOM PROCEDURE TRAY) ×2 IMPLANT
POUCH SPECIMEN RETRIEVAL 10MM (ENDOMECHANICALS) ×2 IMPLANT
SCISSORS LAP 5X35 DISP (ENDOMECHANICALS) ×2 IMPLANT
SET CHOLANGIOGRAPH MIX (MISCELLANEOUS) ×2 IMPLANT
SET IRRIG TUBING LAPAROSCOPIC (IRRIGATION / IRRIGATOR) ×2 IMPLANT
SLEEVE XCEL OPT CAN 5 100 (ENDOMECHANICALS) ×2 IMPLANT
SPONGE GAUZE 2X2 STER 10/PKG (GAUZE/BANDAGES/DRESSINGS) ×1
SUT MNCRL AB 4-0 PS2 18 (SUTURE) ×2 IMPLANT
SYR 20CC LL (SYRINGE) ×2 IMPLANT
TOWEL OR 17X26 10 PK STRL BLUE (TOWEL DISPOSABLE) ×2 IMPLANT
TOWEL OR NON WOVEN STRL DISP B (DISPOSABLE) ×2 IMPLANT
TRAY LAP CHOLE (CUSTOM PROCEDURE TRAY) ×2 IMPLANT
TROCAR BLADELESS OPT 5 100 (ENDOMECHANICALS) ×2 IMPLANT
TROCAR XCEL NON-BLD 11X100MML (ENDOMECHANICALS) ×2 IMPLANT
TUBING INSUFFLATION 10FT LAP (TUBING) ×2 IMPLANT

## 2013-04-21 NOTE — Anesthesia Preprocedure Evaluation (Signed)
Anesthesia Evaluation  Patient identified by MRN, date of birth, ID band Patient awake    Reviewed: Allergy & Precautions, H&P , NPO status , Patient's Chart, lab work & pertinent test results, reviewed documented beta blocker date and time   Airway Mallampati: I TM Distance: >3 FB Neck ROM: full    Dental  (+) Teeth Intact and Edentulous Upper   Pulmonary    Pulmonary exam normal       Cardiovascular     Neuro/Psych negative neurological ROS  negative psych ROS   GI/Hepatic Neg liver ROS,   Endo/Other  negative endocrine ROS  Renal/GU negative Renal ROS     Musculoskeletal   Abdominal Normal abdominal exam  (+) - obese,   Peds  Hematology negative hematology ROS (+)   Anesthesia Other Findings   Reproductive/Obstetrics negative OB ROS                           Anesthesia Physical Anesthesia Plan  ASA: II  Anesthesia Plan: General   Post-op Pain Management:    Induction: Intravenous  Airway Management Planned: Oral ETT  Additional Equipment:   Intra-op Plan:   Post-operative Plan: Extubation in OR  Informed Consent: I have reviewed the patients History and Physical, chart, labs and discussed the procedure including the risks, benefits and alternatives for the proposed anesthesia with the patient or authorized representative who has indicated his/her understanding and acceptance.   Dental Advisory Given  Plan Discussed with: CRNA and Surgeon  Anesthesia Plan Comments:         Anesthesia Quick Evaluation

## 2013-04-21 NOTE — Anesthesia Postprocedure Evaluation (Signed)
Anesthesia Post Note  Patient: Jeff Barrett  Procedure(s) Performed: Procedure(s) (LRB): LAPAROSCOPIC CHOLECYSTECTOMY WITH INTRAOPERATIVE CHOLANGIOGRAM (N/A)  Anesthesia type: General  Patient location: PACU  Post pain: Pain level controlled  Post assessment: Post-op Vital signs reviewed  Last Vitals:  Filed Vitals:   04/21/13 1000  BP: 144/74  Pulse: 78  Temp:   Resp: 10    Post vital signs: Reviewed  Level of consciousness: sedated  Complications: No apparent anesthesia complications

## 2013-04-21 NOTE — Progress Notes (Signed)
Jeff Barrett ZOX:[id number] DOB: November 21, 1920 DOA: 04/18/2013 PCP: Thayer Headings, MD  Brief narrative: 79 yr old ? former [employer] vet , known h/o hypertension, PAF c Elevated CHADS2Vasc2 score on Xarelto, s/p Cardionet for Tachy-brady hyperlipidemia, mild aortic stenosis and chronic left lower extremity edema, with h/o Appendectomy 02/07/2009 by Dr. Dwain Sarna presented 9/17 with Acute abd pain. [place] Abd=Acute cholecystitis-LFTS markedly elevated, have trended down.  Nursing concerns about borderline hypotension, ? Hypoxia while sleeping.  Cardiology consulted by Gen surg regarding clearance and clearance obtained for cholecystectomy Gi consulted given elevated Bilbi, and MRCP 9/18 neg Was about to go to ERCP when had Chest tightness, probably related to transfusion of FFP on 9/19 Underwent Lap Chole 9/20 + IOC under Dr. Michaell Cowing 9/20  Past medical history-As per Problem list  Consultants:  Gen Surgery-Byerly  Cardiology-Hilty/Harding  GI-Hung  Procedures:  US abdomen  MRCP 9/18  Antibiotics:  Ciprofloxacin 9/17  Unasyn 9/18-->   Subjective  Just back from surgery-anxious about eating Family at bedside No N/V/SOB Mild abd pain. Abotu to have clear liquids    Objective    Interim History: None   Telemetry: NSR per my auscultation Had runs of Afib overnight non sustained  Objective: Filed Vitals:   04/21/13 0955 04/21/13 1000 04/21/13 1017 04/21/13 1302  BP:  144/74 146/78   Pulse: 80 78 90   Temp:   97.2 F (36.2 C)   TempSrc:      Resp: 11 10 20    Height:      Weight:      SpO2: 100% 100% 97% 100%    Intake/Output Summary (Last 24 hours) at 04/21/13 1330 Last data filed at 04/21/13 0945  Gross per 24 hour  Intake 2637.5 ml  Output   1235 ml  Net 1402.5 ml    Exam:  General: alert ,pleasant oriented in NAD Cardiovascular: s1 s2 NSR RRR Respiratory: clear Abdomen: soft, NT,slighlty distended, no rebound, no gaurd Skin soft, Trace edema Neuro  ROM intact  Data Reviewed: Basic Metabolic Panel:  Recent Labs Lab 04/18/13 0908 04/19/13 0420 04/20/13 0418 04/21/13 0555  NA 136 137 138 135  K 3.9 4.1 3.9 3.8  CL 100 103 105 104  CO2 27 29 28 27   GLUCOSE 150* 79 90 108*  BUN 20 20 20 12   CREATININE 1.33 1.46* 1.57* 1.16  CALCIUM 9.1 8.7 8.4 8.1*   Liver Function Tests:  Recent Labs Lab 04/18/13 0908 04/19/13 0420 04/20/13 0418 04/21/13 0555  AST 1009* 580* 287* 212*  ALT 450* 385* 245* 191*  ALKPHOS 191* 187* 170* 245*  BILITOT 1.2 2.1* 1.0 1.7*  PROT 7.1 6.0 5.7* 5.8*  ALBUMIN 2.9* 2.3* 2.1* 2.1*    Recent Labs Lab 04/18/13 0908 04/19/13 0420  LIPASE 36 15   No results found for this basename: AMMONIA,  in the last 168 hours CBC:  Recent Labs Lab 04/18/13 0908 04/19/13 0420 04/20/13 0418 04/21/13 0555  WBC 9.7 8.1 7.1 9.7  NEUTROABS 8.1*  --   --   --   HGB 11.1* 9.9* 10.0* 11.2*  HCT 31.5* 29.8* 29.2* 32.5*  MCV 95.5 97.4 96.4 95.9  PLT 191 166 168 209   Cardiac Enzymes:  Recent Labs Lab 04/18/13 0908 04/20/13 1135  TROPONINI <0.30 <0.30   BNP: No components found with this basename: POCBNP,  CBG:  Recent Labs Lab 04/19/13 0812 04/20/13 2014 04/21/13 0125 04/21/13 0635 04/21/13 1205  GLUCAP 79 93 109* 98 105*    Recent Results (  from the past 240 hour(s))  URINE CULTURE     Status: None   Collection Time    04/18/13 10:23 AM      Result Value Range Status   Specimen Description URINE, CLEAN CATCH   Final   Special Requests NONE   Final   Culture  Setup Time     Final   Value: 04/18/2013 16:19     Performed at [facility] Count     Final   Value: NO GROWTH     Performed at [facility]   Culture     Final   Value: NO GROWTH     Performed at [facility]   Report Status 04/19/2013 FINAL   Final  SURGICAL PCR SCREEN     Status: None   Collection Time    04/20/13  4:42 AM      Result Value Range Status   MRSA, PCR NEGATIVE  NEGATIVE  Final   Staphylococcus aureus NEGATIVE  NEGATIVE Final   Comment:            The Xpert SA Assay (FDA     approved for NASAL specimens     in patients over 77 years of age),     is one component of     a comprehensive surveillance     program.  Test performance has     been validated by [employer] for patients greater     than or equal to 11 year old.     It is not intended     to diagnose infection nor to     guide or monitor treatment.     Studies:              All Imaging reviewed and is as per above notation   Scheduled Meds: . acetaminophen  1,000 mg Oral TID  . ampicillin-sulbactam (UNASYN) IV  3 g Intravenous Q8H  . atorvastatin  20 mg Oral Daily  . cholecalciferol  1,000 Units Oral Daily  . doxazosin  2 mg Oral QHS  . flunisolide  2 spray Nasal BID  . furosemide  20 mg Oral Daily  . Ipratropium-Albuterol  1 puff Inhalation Q6H  . lip balm  1 application Topical BID  . metoprolol succinate  25 mg Oral q morning - 10a  . multivitamin with minerals  1 tablet Oral Daily  . pantoprazole  40 mg Oral Daily  . psyllium  1 packet Oral BID  . [START ON 04/23/2013] rivaroxaban  15 mg Oral Daily  . saccharomyces boulardii  250 mg Oral BID  . sodium chloride  3 mL Intravenous Q12H   Continuous Infusions:     Assessment/Plan: 1. Acute Cholecystitis-General surgery kindly address  Antibiotics duration?  Peri-op pain management/fluids-I have held his tylenol in view of LFT's  Resumption Anti-coagulation-apparently Xarelto to re-start 9/21 10:00 am   1. Hypotension-iatrogenic, 2/2 to B-blocker, Cardizem + Cardura-Discussed with Corine Shelter of [facility]-recommends cutting Cardizem 240-->120mg  daily.   This was d/c completely 9/19 pm for bradycardia into the 30's 2. Chest tightness -patient was receiving FFP at the time. Potentially transfusion reaction however with his known atrial fibrillation could have been from tachy?  Was MSk per Cards-has clean cath this year  2014 3. Hypoglycemia-continue D5-saline 75 cc till surgery 4. H/o PAF-currently in NSR-note that patient has a slightly elevated INR 9/19 and 1.5 which is a prompted VitK 10 milligrams as well  as FFP-INR currently 1.17-Xarelto to re-start 9/21 am 5. BPH-cut back cardura dose from 4mg -->2mg  6. HLD-Continue Atorvastatin peri-op based on observational data this is cardio-protective 7. Unlikely UTI-Continue Unasyn for Intra-abdominal process [cholecystitis] is a will hold all oral he was.  His urine was "clean catch" and I am not convinced he has pyelonephritis  Code Status: Full Family Communication: discussed with wife and son in detail at bedside.  Understand Disposition Plan:  Inpatient    Pleas Koch, MD  [facility] Pager [phone] 04/21/2013, 1:30 PM    LOS: 3 days

## 2013-04-21 NOTE — Op Note (Signed)
04/18/2013 - 04/21/2013  9:17 AM  PATIENT:  Jeff Barrett  79 y.o. male  Patient Care Team: Thayer Headings, MD as PCP - General (Internal Medicine) Theda Belfast, MD as Consulting Physician (Gastroenterology)  PRE-OPERATIVE DIAGNOSIS:    Choledocholithiasis status post ERCP  Chronic cholecystitis with cholecystolithiasis  POST-OPERATIVE DIAGNOSIS:    Choledocholithiasis status post ERCP  Chronic cholecystitis with cholecystolithiasis  PROCEDURE:  Procedure(s): LAPAROSCOPIC CHOLECYSTECTOMY WITH INTRAOPERATIVE CHOLANGIOGRAM  SURGEON:  Surgeon(s): Ardeth Sportsman, MD  ANESTHESIA:   local and general  EBL:  Total I/O In: 850 [I.V.:850] Out: 10 [Blood:10]  Delay start of Pharmacological VTE agent (>24hrs) due to surgical blood loss or risk of bleeding:  no  DRAINS: none   SPECIMEN:  Source of Specimen:  Gallbladder  DISPOSITION OF SPECIMEN:  PATHOLOGY  COUNTS:  YES  PLAN OF CARE: Admit to inpatient   PATIENT DISPOSITION:  PACU - hemodynamically stable.  INDICATION: Pleasant elderly gentleman with cardiac issues and A. Fib on chronic anticoagulation.  Developed cholecystitis and choledocholithiasis.  Underwent ERCP.  Recommendation is made for cholecystectomy to prevent further problems:  The anatomy & physiology of hepatobiliary & pancreatic function was discussed.  The pathophysiology of gallbladder dysfunction was discussed.  Natural history risks without surgery was discussed.   I feel the risks of no intervention will lead to serious problems that outweigh the operative risks; therefore, I recommended cholecystectomy to remove the pathology.  I explained laparoscopic techniques with possible need for an open approach.  Probable cholangiogram to evaluate the bilary tract was explained as well.    Risks such as bleeding, infection, abscess, leak, injury to other organs, need for further treatment, heart attack, death, and other risks were discussed.  I noted a good  likelihood this will help address the problem.  Possibility that this will not correct all abdominal symptoms was explained.  Goals of post-operative recovery were discussed as well.  We will work to minimize complications.  An educational handout further explaining the pathology and treatment options was given as well.  Questions were answered.  The patient expresses understanding & wishes to proceed with surgery.    OR FINDINGS: Moderate adhesions of greater omentum to the gallbladder.  Gallbladder thickened and distended.  Consistent with chronic cholecystitis.  Cholangiography notes no abnormalities.  Possible small common bile duct stone remaining but nonobstructive.  Flows easily into duodenum.  DESCRIPTION:   The patient was identified & brought into the operating room. The patient was positioned supine with arms tucked. SCDs were active during the entire case. The patient underwent general anesthesia without any difficulty.  The abdomen was prepped and draped in a sterile fashion. A Surgical Timeout confirmed our plan.  We positioned the patient in reverse [place] & right side up.  I placed a 5mm laparoscopic port through the abdominal wall using optical entry technique in the right upper quadrant.  Entry was clean.  We induced carbon dioxide insufflation. Camera inspection revealed no injury.  There were no adhesions to the anterior abdominal wall supraumbilically.  I proceeded to continue with laparoscopic technique. I placed a #5 port in supraumbilical region, another 5mm port in the right flank near the anterior axillary line, and a 10mm port in the left subxiphoid region obliquely within the falciform ligament.  I turned attention to the right upper quadrant.  There are moderate lesions of greater omentum to the gallbladder implying acute on chronic inflammation.  I carefully freed this off using a hook cautery and  controlled dissection.  The gallbladder fundus was elevated cephalad. I  used hook cautery to free the peritoneal coverings between the gallbladder and the liver on the posteriolateral and anteriomedial walls.   I used careful blunt and hook dissection to help get a good critical view of the cystic artery and cystic duct. I did further dissection to free a few centimeters of the  gallbladder off the liver bed to get a good critical view of the infundibulum and cystic duct. I mobilized the cystic artery; and, after getting a good 360 view, ligated the cystic artery using clips. I skeletonized the cystic duct.  I placed a clip on the infundibulum. I did a partial cystic duct-otomy and ensured patency. I placed a 5 [place] cholangiocatheter through a puncture site at the right subcostal ridge of the abdominal wall and directed it into the cystic duct.  We ran a cholangiogram with dilute radio-opaque contrast and continuous fluoroscopy.  First run and apply the leak.  I repositioned the catheter and reapplied the clip on the cystic ductotomy. Contrast flowed from a side branch consistent with cystic duct cannulization. Contrast flowed up the common hepatic duct into the right and left intrahepatic chains out to secondary radicals. Contrast flowed down the common bile duct easily across the normal ampulla into the duodenum.  This was consistent with a normal cholangiogram.  I removed the cholangiocatheter. I placed clips on the cystic duct x4.   I completed cystic duct transection. I freed the gallbladder from its remaining attachments to the liver. I ensured hemostasis on the gallbladder fossa of the liver and elsewhere. I inspected the rest of the abdomen & detected no injury nor bleeding elsewhere.  I removed the gallbladder within the Endocatch bag.  I closed the subxiphoid fascia transversely using 0 Vicryl interrupted stitches . A closed the skin using 4-0 monocryl stitch.  Sterile dressings were applied. The patient was extubated & arrived in the PACU in stable condition..  I  had discussed postoperative care with the patient in the holding area.  I am about to locate the patient's family and discuss operative findings and postoperative goals / instructions.  Instructions are written in the chart as well.

## 2013-04-21 NOTE — Transfer of Care (Signed)
Immediate Anesthesia Transfer of Care Note  Patient: Jeff Barrett  Procedure(s) Performed: Procedure(s): LAPAROSCOPIC CHOLECYSTECTOMY WITH INTRAOPERATIVE CHOLANGIOGRAM (N/A)  Patient Location: PACU  Anesthesia Type:General  Level of Consciousness: awake, alert , oriented and patient cooperative  Airway & Oxygen Therapy: Patient Spontanous Breathing and Patient connected to face mask oxygen  Post-op Assessment: Report given to PACU RN and Post -op Vital signs reviewed and stable  Tachycardia controlled with esmolol.  Post vital signs: Reviewed  Complications: No apparent anesthesia complications

## 2013-04-21 NOTE — Progress Notes (Signed)
PT Cancellation Note  Patient Details Name: Jeff Barrett MRN: [id number] DOB: February 06, 1921   Cancelled Treatment:    Reason Eval/Treat Not Completed: Patient at procedure or test/unavailable  Pt with OR today.  Please reorder PT when pt ready to begin OOB and ambulation activities. Thanks!   Ebony Hail [facility] 04/21/2013, 10:55 AM

## 2013-04-21 NOTE — Progress Notes (Signed)
Patient unable to void. Bladder scan revealed 950 in bladder. Dr. Mahala Menghini called and order was given to place foley catheter and to place order for flomax 0.4 daily with first dose now. Will continue to monitor. Erskin Burnet RN

## 2013-04-21 NOTE — Progress Notes (Signed)
Jeff Barrett [id number] [birth year]  CARE TEAM:  PCP: Thayer Headings, MD  Outpatient Care Team: Patient Care Team: Thayer Headings, MD as PCP - General (Internal Medicine) Theda Belfast, MD as Consulting Physician (Gastroenterology)  Inpatient Treatment Team: Treatment Team: Attending Provider: Rhetta Mura, MD; Consulting Physician: Bishop Limbo, MD; Rounding Team: Delmer Islam, MD; Technician: Vella Raring, NT; Consulting Physician: Chrystie Nose, MD; Registered Nurse: Tristan Schroeder, RN; Registered Nurse: Vivien Rossetti, RN; Technician: Karren Burly, [place]; Registered Nurse: Aliene Beams, RN; Physical Therapist: Donnetta Hail, PT   Subjective:  Patient feels comfortable.  No nausea or vomiting.  Ready to have surgery  Objective:  Vital signs:  Filed Vitals:   04/20/13 1705 04/20/13 2217 04/21/13 0500 04/21/13 0556  BP: 142/58 131/54  113/85  Pulse: 43 67  65  Temp: 97 F (36.1 C) 98 F (36.7 C)  98.6 F (37 C)  TempSrc: Oral Oral  Oral  Resp: 15 20  18   Height:      Weight:   138 lb (62.596 kg)   SpO2: 100% 100%  98%    Last BM Date: 04/20/13  Intake/Output   Yesterday:  09/19 0701 - 09/20 0700 In: 1800 [I.V.:1462.5; Blood:137.5; IV Piggyback:200] Out: 1425 [Urine:1425] This shift:     Bowel function:  Flatus: y  BM: n  Drain: n/a  Physical Exam:  General: Pt awake/alert/oriented x4 in no acute distress Eyes: PERRL, normal EOM.  Sclera clear.  No icterus Neuro: CN II-XII intact w/o focal sensory/motor deficits. Lymph: No head/neck/groin lymphadenopathy Psych:  No delerium/psychosis/paranoia HENT: Normocephalic, Mucus membranes moist.  No thrush Neck: Supple, No tracheal deviation Chest: No chest wall pain w good excursion CV:  Pulses intact.  Regular rhythm MS: Normal AROM mjr joints.  No obvious deformity Abdomen: Soft.  Nondistended.  Mildly tender at epigastric region.  No evidence of peritonitis.   No incarcerated hernias. Ext:  SCDs BLE.  No mjr edema.  No cyanosis Skin: No petechiae / purpura   Problem List:   Principal Problem:   Cholecystitis, acute Active Problems:   Paroxysmal atrial fibrillation   Essential hypertension   Hyperlipidemia   Aortic sclerosis   Diastolic dysfunction- grade 2   Chronic anticoagulation- Xarelto for PAF   Chronic renal insufficiency, stage III (moderate)   Assessment  Jeff Barrett  79 y.o. male     Procedure(s): LAPAROSCOPIC CHOLECYSTECTOMY WITH INTRAOPERATIVE CHOLANGIOGRAM  Gallstone pancreatitis with probable cholecystitis  Plan:  Cholecystectomy.  Reasonable laparoscopic candidate.  I discussed it with them as my partner had before:  The anatomy & physiology of hepatobiliary & pancreatic function was discussed.  The pathophysiology of gallbladder dysfunction was discussed.  Natural history risks without surgery was discussed.   I feel the risks of no intervention will lead to serious problems that outweigh the operative risks; therefore, I recommended cholecystectomy to remove the pathology.  I explained laparoscopic techniques with possible need for an open approach.  Probable cholangiogram to evaluate the bilary tract was explained as well.    Risks such as bleeding, infection, abscess, leak, injury to other organs, need for further treatment, heart attack, death, and other risks were discussed.  I noted a good likelihood this will help address the problem.  Possibility that this will not correct all abdominal symptoms was explained.  Goals of post-operative recovery were discussed as well.  We will work to minimize complications.  An educational handout further explaining the pathology and treatment options  was given as well.  Questions were answered.  The patient expresses understanding & wishes to proceed with surgery.  -Gastroenterology and cardiology care.   -Internal medicine care. -VTE prophylaxis- SCDs, etc -mobilize as  tolerated to help recovery  Ardeth Sportsman, M.D., F.A.C.S. Gastrointestinal and Minimally Invasive Surgery [facility] [address], [address] [place], [place] [address] [phone] Main / Paging   04/21/2013   Results:   Labs: Results for orders placed during the hospital encounter of 04/18/13 (from the past 48 hour(s))  GLUCOSE, CAPILLARY     Status: None   Collection Time    04/19/13  8:12 AM      Result Value Range   Glucose-Capillary 79  70 - 99 mg/dL  CBC     Status: Abnormal   Collection Time    04/20/13  4:18 AM      Result Value Range   WBC 7.1  4.0 - 10.5 K/uL   RBC 3.03 (*) 4.22 - 5.81 MIL/uL   Hemoglobin 10.0 (*) 13.0 - 17.0 g/dL   HCT 64.4 (*) 03.4 - 74.2 %   MCV 96.4  78.0 - 100.0 fL   MCH 33.0  26.0 - 34.0 pg   MCHC 34.2  30.0 - 36.0 g/dL   RDW 59.5  63.8 - 75.6 %   Platelets 168  150 - 400 K/uL  COMPREHENSIVE METABOLIC PANEL     Status: Abnormal   Collection Time    04/20/13  4:18 AM      Result Value Range   Sodium 138  135 - 145 mEq/L   Potassium 3.9  3.5 - 5.1 mEq/L   Chloride 105  96 - 112 mEq/L   CO2 28  19 - 32 mEq/L   Glucose, Bld 90  70 - 99 mg/dL   BUN 20  6 - 23 mg/dL   Creatinine, Ser 4.33 (*) 0.50 - 1.35 mg/dL   Calcium 8.4  8.4 - 29.5 mg/dL   Total Protein 5.7 (*) 6.0 - 8.3 g/dL   Albumin 2.1 (*) 3.5 - 5.2 g/dL   AST 188 (*) 0 - 37 U/L   ALT 245 (*) 0 - 53 U/L   Alkaline Phosphatase 170 (*) 39 - 117 U/L   Total Bilirubin 1.0  0.3 - 1.2 mg/dL   GFR calc non Af Amer 37 (*) >90 mL/min   GFR calc Af Amer 43 (*) >90 mL/min   Comment: (NOTE)     The eGFR has been calculated using the CKD EPI equation.     This calculation has not been validated in all clinical situations.     eGFR's persistently <90 mL/min signify possible Chronic Kidney     Disease.  PROTIME-INR     Status: Abnormal   Collection Time    04/20/13  4:18 AM      Result Value Range   Prothrombin Time 17.9 (*) 11.6 - 15.2 seconds   INR 1.52  (*) 0.00 - 1.49  HEPARIN LEVEL (UNFRACTIONATED)     Status: None   Collection Time    04/20/13  4:18 AM      Result Value Range   Heparin Unfractionated 0.55  0.30 - 0.70 IU/mL   Comment:            IF HEPARIN RESULTS ARE BELOW     EXPECTED VALUES, AND PATIENT     DOSAGE HAS BEEN CONFIRMED,     SUGGEST FOLLOW UP TESTING  OF ANTITHROMBIN III LEVELS.  SURGICAL PCR SCREEN     Status: None   Collection Time    04/20/13  4:42 AM      Result Value Range   MRSA, PCR NEGATIVE  NEGATIVE   Staphylococcus aureus NEGATIVE  NEGATIVE   Comment:            The Xpert SA Assay (FDA     approved for NASAL specimens     in patients over 21 years of age),     is one component of     a comprehensive surveillance     program.  Test performance has     been validated by [employer] for patients greater     than or equal to 65 year old.     It is not intended     to diagnose infection nor to     guide or monitor treatment.  TYPE AND SCREEN     Status: None   Collection Time    04/20/13  7:50 AM      Result Value Range   ABO/RH(D) O POS     Antibody Screen NEG     Sample Expiration 04/23/2013    ABO/RH     Status: None   Collection Time    04/20/13  7:50 AM      Result Value Range   ABO/RH(D) O POS    PREPARE FRESH FROZEN PLASMA     Status: None   Collection Time    04/20/13  8:00 AM      Result Value Range   Unit Number [id number]     Blood Component Type THAWED PLASMA     Unit division 00     Status of Unit ISSUED     Transfusion Status OK TO TRANSFUSE    TROPONIN I     Status: None   Collection Time    04/20/13 11:35 AM      Result Value Range   Troponin I <0.30  <0.30 ng/mL   Comment:            Due to the release kinetics of cTnI,     a negative result within the first hours     of the onset of symptoms does not rule out     myocardial infarction with certainty.     If myocardial infarction is still suspected,     repeat the test at appropriate intervals.   PROTIME-INR     Status: Abnormal   Collection Time    04/20/13  5:43 PM      Result Value Range   Prothrombin Time 15.9 (*) 11.6 - 15.2 seconds   INR 1.30  0.00 - 1.49  GLUCOSE, CAPILLARY     Status: None   Collection Time    04/20/13  8:14 PM      Result Value Range   Glucose-Capillary 93  70 - 99 mg/dL  GLUCOSE, CAPILLARY     Status: Abnormal   Collection Time    04/21/13  1:25 AM      Result Value Range   Glucose-Capillary 109 (*) 70 - 99 mg/dL  CBC     Status: Abnormal   Collection Time    04/21/13  5:55 AM      Result Value Range   WBC 9.7  4.0 - 10.5 K/uL   RBC 3.39 (*) 4.22 - 5.81 MIL/uL   Hemoglobin 11.2 (*) 13.0 - 17.0 g/dL   HCT  32.5 (*) 39.0 - 52.0 %   MCV 95.9  78.0 - 100.0 fL   MCH 33.0  26.0 - 34.0 pg   MCHC 34.5  30.0 - 36.0 g/dL   RDW 16.1  09.6 - 04.5 %   Platelets 209  150 - 400 K/uL  HEPARIN LEVEL (UNFRACTIONATED)     Status: Abnormal   Collection Time    04/21/13  5:55 AM      Result Value Range   Heparin Unfractionated 0.10 (*) 0.30 - 0.70 IU/mL   Comment:            IF HEPARIN RESULTS ARE BELOW     EXPECTED VALUES, AND PATIENT     DOSAGE HAS BEEN CONFIRMED,     SUGGEST FOLLOW UP TESTING     OF ANTITHROMBIN III LEVELS.  COMPREHENSIVE METABOLIC PANEL     Status: Abnormal   Collection Time    04/21/13  5:55 AM      Result Value Range   Sodium 135  135 - 145 mEq/L   Potassium 3.8  3.5 - 5.1 mEq/L   Chloride 104  96 - 112 mEq/L   CO2 27  19 - 32 mEq/L   Glucose, Bld 108 (*) 70 - 99 mg/dL   BUN 12  6 - 23 mg/dL   Creatinine, Ser 4.09  0.50 - 1.35 mg/dL   Calcium 8.1 (*) 8.4 - 10.5 mg/dL   Total Protein 5.8 (*) 6.0 - 8.3 g/dL   Albumin 2.1 (*) 3.5 - 5.2 g/dL   AST 811 (*) 0 - 37 U/L   ALT 191 (*) 0 - 53 U/L   Alkaline Phosphatase 245 (*) 39 - 117 U/L   Total Bilirubin 1.7 (*) 0.3 - 1.2 mg/dL   GFR calc non Af Amer 54 (*) >90 mL/min   GFR calc Af Amer 62 (*) >90 mL/min   Comment: (NOTE)     The eGFR has been calculated using the CKD EPI  equation.     This calculation has not been validated in all clinical situations.     eGFR's persistently <90 mL/min signify possible Chronic Kidney     Disease.  PROTIME-INR     Status: None   Collection Time    04/21/13  5:55 AM      Result Value Range   Prothrombin Time 14.7  11.6 - 15.2 seconds   INR 1.17  0.00 - 1.49  GLUCOSE, CAPILLARY     Status: None   Collection Time    04/21/13  6:35 AM      Result Value Range   Glucose-Capillary 98  70 - 99 mg/dL    Imaging / Studies: Mr 3d Recon At Scanner  29-Apr-2013   CLINICAL DATA:  Right upper quadrant abdominal pain with elevated liver function tests.  EXAM: MRI ABDOMEN WITHOUT AND WITH CONTRAST (INCLUDING MRCP)  TECHNIQUE: Multiplanar multisequence MR imaging of the abdomen was performed both before and after the administration of intravenous contrast. Heavily T2-weighted images of the biliary and pancreatic ducts were obtained, and three-dimensional MRCP images were rendered by post processing.  CONTRAST:  7mL MULTIHANCE GADOBENATE DIMEGLUMINE 529 MG/ML IV SOLN  COMPARISON:  04/18/2013  FINDINGS: Despite efforts by the technologist and patient, motion artifact is present on today's exam and could not be eliminated. This reduces exam sensitivity and specificity.  Small bilateral pleural effusions with passive atelectasis. Mild right heart prominence.  T2 hyperintense 1.9 x 1.4 cm cyst in segment 8. Spleen and adrenal  glands unremarkable.  Diffuse subcutaneous, mesenteric, and retroperitoneal edema. On image 22 of series 3 there is a 10 mm filling defect with low T2 and high precontrast T1 signal in the common hepatic duct or common bile duct. Possible dependent layering of smaller filling defects distal to this larger filling defect. I am uncertain if the filling defect enhances given the high precontrast T1 signal and motion artifact limitations of the exam. There also appear to be filling defects dependently in the gallbladder compatible with  gallstones within the gallbladder. Gallbladder wall thickening is present with mild pericholecystic fluid.  The dorsal pancreatic duct borderline prominent. Bilateral renal upper pole benign cysts noted.  There is dextroconvex lumbar scoliosis with spondylosis and degenerative disc disease.  The abdominal aorta is abnormal. There is an infrarenal saccular aneurysm with the abdominal aorta measuring 3.4 cm transverse and the saccular aneurysm with potential focal dissection measuring 1.3 cm in width along the right side of the abdominal aorta. There is also a focal dissection further caudad in the abdominal aorta. High precontrast T1 signal along the right side of the abdominal aorta in the vicinity of the right renal artery origin could represent a small acute intramural hematoma.  IMPRESSION: 1. Highly limited exam due to motion artifact, with the CBD nearly completely obscured on the dedicated MRCP images due to patient inability to suspend respiration or control motion. However, axial images suggest a 10 mm filling defect in the junction of the common hepatic duct and common bile duct with high T1 and low T2 signal characteristics. Possible similar smaller filling defects dependently in the distal CBD. The high T1 signal characteristics are unusual for stone but could represent subacute clot in the duct. Tumor is a less likely differential diagnostic consideration. Common bile duct caliber 10 mm, mildly dilated. 2. Diffuse subcutaneous, retroperitoneal, and mesenteric edema compatible with extensive 3rd spacing of fluid. Small bilateral pleural effusions. There is gallbladder wall thickening and mild pericholecystic fluid. Enlarged right heart. 3. Cholelithiasis 4. Abnormal abdominal aorta with suspected acute intramural hematoma in the vicinity of the right renal artery origin, focal dissection inferiorly, and a saccular infrarenal abdominal aortic aneurysm. 5. Hepatic and renal cysts. 6. Considerable  dextroconvex lumbar scoliosis with rotary component.   Electronically Signed   By: Herbie Baltimore   On: 04/20/2013 08:35   Dg Ercp Biliary & Pancreatic Ducts  04/20/2013   *RADIOLOGY REPORT*  Clinical Data: Bile duct stones.  Fluoroscopy time:  3 minutes 24 seconds.  ERCP  Comparison: MRCP dated 04/20/2013 and abdominal ultrasound dated 04/18/2013  Findings: Several images demonstrate filling defects in the common bile duct.  Slight dilatation of intra and extrahepatic bile ducts.  IMPRESSION: Common bile duct stones.   Original Report Authenticated By: Francene Boyers, M.D.   Mr Abd W/wo Cm/mrcp  04/20/2013   CLINICAL DATA:  Right upper quadrant abdominal pain with elevated liver function tests.  EXAM: MRI ABDOMEN WITHOUT AND WITH CONTRAST (INCLUDING MRCP)  TECHNIQUE: Multiplanar multisequence MR imaging of the abdomen was performed both before and after the administration of intravenous contrast. Heavily T2-weighted images of the biliary and pancreatic ducts were obtained, and three-dimensional MRCP images were rendered by post processing.  CONTRAST:  7mL MULTIHANCE GADOBENATE DIMEGLUMINE 529 MG/ML IV SOLN  COMPARISON:  04/18/2013  FINDINGS: Despite efforts by the technologist and patient, motion artifact is present on today's exam and could not be eliminated. This reduces exam sensitivity and specificity.  Small bilateral pleural effusions with passive atelectasis. Mild right  heart prominence.  T2 hyperintense 1.9 x 1.4 cm cyst in segment 8. Spleen and adrenal glands unremarkable.  Diffuse subcutaneous, mesenteric, and retroperitoneal edema. On image 22 of series 3 there is a 10 mm filling defect with low T2 and high precontrast T1 signal in the common hepatic duct or common bile duct. Possible dependent layering of smaller filling defects distal to this larger filling defect. I am uncertain if the filling defect enhances given the high precontrast T1 signal and motion artifact limitations of the exam.  There also appear to be filling defects dependently in the gallbladder compatible with gallstones within the gallbladder. Gallbladder wall thickening is present with mild pericholecystic fluid.  The dorsal pancreatic duct borderline prominent. Bilateral renal upper pole benign cysts noted.  There is dextroconvex lumbar scoliosis with spondylosis and degenerative disc disease.  The abdominal aorta is abnormal. There is an infrarenal saccular aneurysm with the abdominal aorta measuring 3.4 cm transverse and the saccular aneurysm with potential focal dissection measuring 1.3 cm in width along the right side of the abdominal aorta. There is also a focal dissection further caudad in the abdominal aorta. High precontrast T1 signal along the right side of the abdominal aorta in the vicinity of the right renal artery origin could represent a small acute intramural hematoma.  IMPRESSION: 1. Highly limited exam due to motion artifact, with the CBD nearly completely obscured on the dedicated MRCP images due to patient inability to suspend respiration or control motion. However, axial images suggest a 10 mm filling defect in the junction of the common hepatic duct and common bile duct with high T1 and low T2 signal characteristics. Possible similar smaller filling defects dependently in the distal CBD. The high T1 signal characteristics are unusual for stone but could represent subacute clot in the duct. Tumor is a less likely differential diagnostic consideration. Common bile duct caliber 10 mm, mildly dilated. 2. Diffuse subcutaneous, retroperitoneal, and mesenteric edema compatible with extensive 3rd spacing of fluid. Small bilateral pleural effusions. There is gallbladder wall thickening and mild pericholecystic fluid. Enlarged right heart. 3. Cholelithiasis 4. Abnormal abdominal aorta with suspected acute intramural hematoma in the vicinity of the right renal artery origin, focal dissection inferiorly, and a saccular  infrarenal abdominal aortic aneurysm. 5. Hepatic and renal cysts. 6. Considerable dextroconvex lumbar scoliosis with rotary component.   Electronically Signed   By: Herbie Baltimore   On: 04/20/2013 08:35    Medications / Allergies: per chart  Antibiotics: Anti-infectives   Start     Dose/Rate Route Frequency Ordered Stop   04/19/13 1000  Ampicillin-Sulbactam (UNASYN) 3 g in sodium chloride 0.9 % 100 mL IVPB     3 g 100 mL/hr over 60 Minutes Intravenous Every 8 hours 04/19/13 0911     04/18/13 1600  ciprofloxacin (CIPRO) IVPB 400 mg  Status:  Discontinued     400 mg 200 mL/hr over 60 Minutes Intravenous Every 12 hours 04/18/13 1433 04/19/13 0911   04/18/13 1145  piperacillin-tazobactam (ZOSYN) IVPB 3.375 g     3.375 g 100 mL/hr over 30 Minutes Intravenous  Once 04/18/13 1141 04/18/13 1337

## 2013-04-22 LAB — COMPREHENSIVE METABOLIC PANEL
Alkaline Phosphatase: 313 U/L — ABNORMAL HIGH (ref 39–117)
BUN: 9 mg/dL (ref 6–23)
CO2: 27 mEq/L (ref 19–32)
GFR calc Af Amer: 64 mL/min — ABNORMAL LOW (ref >90)
GFR calc non Af Amer: 56 mL/min — ABNORMAL LOW (ref >90)
Glucose, Bld: 101 mg/dL — ABNORMAL HIGH (ref 70–99)
Potassium: 3.4 mEq/L — ABNORMAL LOW (ref 3.5–5.1)
Sodium: 132 mEq/L — ABNORMAL LOW (ref 135–145)
Total Bilirubin: 2.8 mg/dL — ABNORMAL HIGH (ref 0.3–1.2)
Total Protein: 6.1 g/dL (ref 6.0–8.3)

## 2013-04-22 LAB — CBC WITH DIFFERENTIAL/PLATELET
Basophils Absolute: 0 10*3/uL (ref 0.0–0.1)
Eosinophils Absolute: 0 10*3/uL (ref 0.0–0.7)
Eosinophils Relative: 0 % (ref 0–5)
HCT: 31.2 % — ABNORMAL LOW (ref 39.0–52.0)
Lymphocytes Relative: 10 % — ABNORMAL LOW (ref 12–46)
MCH: 34.5 pg — ABNORMAL HIGH (ref 26.0–34.0)
MCHC: 36.2 g/dL — ABNORMAL HIGH (ref 30.0–36.0)
MCV: 95.1 fL (ref 78.0–100.0)
Monocytes Absolute: 0.8 10*3/uL (ref 0.1–1.0)
Neutro Abs: 6.7 10*3/uL (ref 1.7–7.7)
Platelets: 184 10*3/uL (ref 150–400)
RDW: 14.3 % (ref 11.5–15.5)
WBC: 8.5 10*3/uL (ref 4.0–10.5)

## 2013-04-22 LAB — GLUCOSE, CAPILLARY: Glucose-Capillary: 98 mg/dL (ref 70–99)

## 2013-04-22 MED ORDER — DILTIAZEM HCL 60 MG PO TABS
60.0000 mg | ORAL_TABLET | Freq: Two times a day (BID) | ORAL | Status: DC
  Administered 2013-04-22 – 2013-04-24 (×4): 60 mg via ORAL
  Filled 2013-04-22 (×7): qty 1

## 2013-04-22 MED ORDER — IPRATROPIUM-ALBUTEROL 20-100 MCG/ACT IN AERS
1.0000 | INHALATION_SPRAY | Freq: Three times a day (TID) | RESPIRATORY_TRACT | Status: DC
  Administered 2013-04-22 – 2013-04-24 (×6): 1 via RESPIRATORY_TRACT
  Filled 2013-04-22: qty 4

## 2013-04-22 NOTE — Progress Notes (Signed)
Noted tachycardic rates recorded post-op; Re-initiated low dose CCB.  We are available for assistance & will check in on him periodically & prior to d/c. Will need Xarelto restarted prior to d/c  -- when safe post-op  [facility] will assist with arranging OP ROV appt with Dr.Berry or PA/NP shortly post-d/c. Marykay Lex, MD

## 2013-04-22 NOTE — Progress Notes (Signed)
1 Day Post-Op  Subjective: Pain improving.  Tolerating liquids without nausea  Objective: Vital signs in last 24 hours: Temp:  [97.5 F (36.4 C)-97.8 F (36.6 C)] 97.8 F (36.6 C) (09/21 0507) Pulse Rate:  [63-68] 63 (09/21 1027) Resp:  [18-20] 18 (09/21 0507) BP: (118-147)/(60-69) 118/60 mmHg (09/21 1027) SpO2:  [94 %-100 %] 94 % (09/21 0744) Weight:  [140 lb 6.9 oz (63.7 kg)] 140 lb 6.9 oz (63.7 kg) (09/21 0507) Last BM Date: 04/20/13  Intake/Output from previous day: 09/20 0701 - 09/21 0700 In: 1490 [P.O.:240; I.V.:950; IV Piggyback:300] Out: 2940 [Urine:2930; Blood:10] Intake/Output this shift:    General appearance: alert, cooperative and no distress GI: soft, appropriate incisional tenderness, ND, wounds look good  Lab Results:   Recent Labs  04/21/13 0555 04/22/13 0526  WBC 9.7 8.5  HGB 11.2* 11.3*  HCT 32.5* 31.2*  PLT 209 184   BMET  Recent Labs  04/21/13 0555 04/22/13 0526  NA 135 132*  K 3.8 3.4*  CL 104 101  CO2 27 27  GLUCOSE 108* 101*  BUN 12 9  CREATININE 1.16 1.13  CALCIUM 8.1* 8.3*   PT/INR  Recent Labs  04/21/13 0555 04/22/13 0526  LABPROT 14.7 15.9*  INR 1.17 1.30   ABG No results found for this basename: PHART, PCO2, PO2, HCO3,  in the last 72 hours  Studies/Results: Dg Cholangiogram Operative  04/21/2013   CLINICAL DATA:  Gallstones  EXAM: INTRAOPERATIVE CHOLANGIOGRAM  TECHNIQUE: Cholangiographic images from the C-arm fluoroscopic device were submitted for interpretation post-operatively. Please see the procedural report for the amount of contrast and the fluoroscopy time utilized.  COMPARISON:  None.  FINDINGS: Contrast fills the biliary tree to the level of the upper common bile duct. Contrast never passes into the distal common bile duct or duodenum. Contrast leakage is noted. Bile leak cannot be excluded.  IMPRESSION: The common bile duct is occluded. Bile leak cannot be excluded.   Electronically Signed   By: Maryclare Bean M.D.    On: 04/21/2013 10:03   Dg Ercp Biliary & Pancreatic Ducts  04/20/2013   *RADIOLOGY REPORT*  Clinical Data: Bile duct stones.  Fluoroscopy time:  3 minutes 24 seconds.  ERCP  Comparison: MRCP dated 04/20/2013 and abdominal ultrasound dated 04/18/2013  Findings: Several images demonstrate filling defects in the common bile duct.  Slight dilatation of intra and extrahepatic bile ducts.  IMPRESSION: Common bile duct stones.   Original Report Authenticated By: Francene Boyers, M.D.    Anti-infectives: Anti-infectives   Start     Dose/Rate Route Frequency Ordered Stop   04/21/13 1800  Ampicillin-Sulbactam (UNASYN) 3 g in sodium chloride 0.9 % 100 mL IVPB     3 g 100 mL/hr over 60 Minutes Intravenous Every 8 hours 04/21/13 1019 04/22/13 1759   04/21/13 0815  ampicillin-sulbactam (UNASYN) injection 3 g  Status:  Discontinued     3 g Intravenous  Once 04/21/13 0811 04/21/13 0815   04/19/13 1000  Ampicillin-Sulbactam (UNASYN) 3 g in sodium chloride 0.9 % 100 mL IVPB  Status:  Discontinued     3 g 100 mL/hr over 60 Minutes Intravenous Every 8 hours 04/19/13 0911 04/21/13 1019   04/18/13 1600  ciprofloxacin (CIPRO) IVPB 400 mg  Status:  Discontinued     400 mg 200 mL/hr over 60 Minutes Intravenous Every 12 hours 04/18/13 1433 04/19/13 0911   04/18/13 1145  piperacillin-tazobactam (ZOSYN) IVPB 3.375 g     3.375 g 100 mL/hr over 30 Minutes  Intravenous  Once 04/18/13 1141 04/18/13 1337      Assessment/Plan: s/p Procedure(s): LAPAROSCOPIC CHOLECYSTECTOMY WITH INTRAOPERATIVE CHOLANGIOGRAM (N/A) LFT's up today. I do not see any evidence of postop complications but I would keep on clears and trend LFT's to ensure that TB is decreasing prior to advancing diet and discharge. If labs are improved tomorrow then he should be okay for discharge to home otherwise if elevated, then reconsult GI  LOS: 4 days    Lodema Pilot DAVID 04/22/2013

## 2013-04-22 NOTE — Progress Notes (Addendum)
Jeff Barrett:[id number] DOB: March 21, 1921 DOA: 04/18/2013 PCP: Thayer Headings, MD  Brief narrative: 79 yr old ? former [employer] vet , known h/o hypertension, PAF c Elevated CHADS2Vasc2 score on Xarelto, s/p Cardionet for Tachy-brady hyperlipidemia, mild aortic stenosis and chronic left lower extremity edema, with h/o Appendectomy 02/07/2009 by Dr. Dwain Sarna presented 9/17 with Acute abd pain. [place] Abd=Acute cholecystitis-LFTS markedly elevated, have trended down.  Nursing concerns about borderline hypotension, ? Hypoxia while sleeping.  Cardiology consulted by Gen surg regarding clearance and clearance obtained for cholecystectomy Gi consulted given elevated Bilbi, and MRCP 9/18 neg Was about to go to ERCP when had Chest tightness, probably related to transfusion of FFP on 9/19 Underwent Lap Chole 9/20 + IOC under Dr. Michaell Cowing 9/20-LFT's still elevated so kept overnight 9/21  Past medical history-As per Problem list  Consultants:  Gen Surgery-[facility]erly  Cardiology-Hilty/Harding  GI-Hung  Procedures:  US abdomen  MRCP 9/18  Antibiotics:  Ciprofloxacin 9/17  Unasyn 9/18-->   Subjective  Doing fair. Patient on clear liquid diet now.  Denies nausea vomiting but has not passed flatus or stool as yet Ambulatory and had slight tenderness on doing the same   Objective    Interim History: None   Telemetry: NSR per my auscultation   Objective: Filed Vitals:   04/22/13 0058 04/22/13 0507 04/22/13 0744 04/22/13 1027  BP:  147/69  118/60  Pulse:  67  63  Temp:  97.8 F (36.6 C)    TempSrc:  Oral    Resp:  18    Height:      Weight:  63.7 kg (140 lb 6.9 oz)    SpO2: 95% 98% 94%     Intake/Output Summary (Last 24 hours) at 04/22/13 1124 Last data filed at 04/22/13 0600  Gross per 24 hour  Intake    440 ml  Output   2930 ml  Net  -2490 ml    Exam:  General: alert ,pleasant oriented in NAD Cardiovascular: s1 s2 NSR RRR Respiratory: clear Abdomen: soft, NT,slighlty  distended, no rebound, no gaurd Skin soft, Trace edema Neuro ROM intact  Data Reviewed: Basic Metabolic Panel:  Recent Labs Lab 04/18/13 0908 04/19/13 0420 04/20/13 0418 04/21/13 0555 04/22/13 0526  NA 136 137 138 135 132*  K 3.9 4.1 3.9 3.8 3.4*  CL 100 103 105 104 101  CO2 27 29 28 27 27   GLUCOSE 150* 79 90 108* 101*  BUN 20 20 20 12 9   CREATININE 1.33 1.46* 1.57* 1.16 1.13  CALCIUM 9.1 8.7 8.4 8.1* 8.3*   Liver Function Tests:  Recent Labs Lab 04/18/13 0908 04/19/13 0420 04/20/13 0418 04/21/13 0555 04/22/13 0526  AST 1009* 580* 287* 212* 226*  ALT 450* 385* 245* 191* 197*  ALKPHOS 191* 187* 170* 245* 313*  BILITOT 1.2 2.1* 1.0 1.7* 2.8*  PROT 7.1 6.0 5.7* 5.8* 6.1  ALBUMIN 2.9* 2.3* 2.1* 2.1* 2.1*    Recent Labs Lab 04/18/13 0908 04/19/13 0420  LIPASE 36 15   No results found for this basename: AMMONIA,  in the last 168 hours CBC:  Recent Labs Lab 04/18/13 0908 04/19/13 0420 04/20/13 0418 04/21/13 0555 04/22/13 0526  WBC 9.7 8.1 7.1 9.7 8.5  NEUTROABS 8.1*  --   --   --  6.7  HGB 11.1* 9.9* 10.0* 11.2* 11.3*  HCT 31.5* 29.8* 29.2* 32.5* 31.2*  MCV 95.5 97.4 96.4 95.9 95.1  PLT 191 166 168 209 184   Cardiac Enzymes:  Recent Labs Lab 04/18/13  4098 04/20/13 1135  TROPONINI <0.30 <0.30   BNP: No components found with this basename: POCBNP,  CBG:  Recent Labs Lab 04/21/13 1205 04/21/13 1657 04/21/13 2059 04/22/13 0035 04/22/13 0620  GLUCAP 105* 108* 141* 136* 98    Recent Results (from the past 240 hour(s))  URINE CULTURE     Status: None   Collection Time    04/18/13 10:23 AM      Result Value Range Status   Specimen Description URINE, CLEAN CATCH   Final   Special Requests NONE   Final   Culture  Setup Time     Final   Value: 04/18/2013 16:19     Performed at [facility] Count     Final   Value: NO GROWTH     Performed at [facility]   Culture     Final   Value: NO GROWTH     Performed at  [facility]   Report Status 04/19/2013 FINAL   Final  SURGICAL PCR SCREEN     Status: None   Collection Time    04/20/13  4:42 AM      Result Value Range Status   MRSA, PCR NEGATIVE  NEGATIVE Final   Staphylococcus aureus NEGATIVE  NEGATIVE Final   Comment:            The Xpert SA Assay (FDA     approved for NASAL specimens     in patients over 3 years of age),     is one component of     a comprehensive surveillance     program.  Test performance has     been validated by [employer] for patients greater     than or equal to 66 year old.     It is not intended     to diagnose infection nor to     guide or monitor treatment.     Studies:              All Imaging reviewed and is as per above notation   Scheduled Meds: . atorvastatin  20 mg Oral Daily  . cholecalciferol  1,000 Units Oral Daily  . diltiazem  60 mg Oral Q12H  . flunisolide  2 spray Nasal BID  . furosemide  20 mg Oral Daily  . Ipratropium-Albuterol  1 puff Inhalation TID  . lip balm  1 application Topical BID  . metoprolol succinate  25 mg Oral q morning - 10a  . multivitamin with minerals  1 tablet Oral Daily  . pantoprazole  40 mg Oral Daily  . psyllium  1 packet Oral BID  . [START ON 04/23/2013] rivaroxaban  15 mg Oral Daily  . saccharomyces boulardii  250 mg Oral BID  . sodium chloride  3 mL Intravenous Q12H  . tamsulosin  0.4 mg Oral Daily   Continuous Infusions:     Assessment/Plan: 1. Acute Cholecystitis-General surgery kindly address  Antibiotics duration  Peri-op pain management/fluids-I have held his tylenol in view of LFT's  Resumption Anti-coagulation- Xarelto re-started 9/21 10:00 am   1. Hypotension-iatrogenic, 2/2 to B-blocker, Cardizem + Cardura-initially  Cardizem 240-->120mg  daily.   This was d/c completely 9/19 pm for bradycardia into the 30's-cardiology has transitioned his diltiazem on 9/21 to 60mg  twice a day.  He is to continue Toprol 25 mg daily. 2. Chest  tightness -patient was receiving FFP at the time. Potentially transfusion reaction however with his  known atrial fibrillation could have been from tachy?  Was MSK per Cards-has clean cath this year 2014 3. Hypoglycemia-continue D5-saline 75 cc till surgery-now d/c 4. H/o PAF-currently in NSR-note that patient has a slightly elevated INR 9/19 and 1.5 which is a prompted VitK 10 milligrams as well as FFP-INR currently 1.17-Xarelto re-start 9/22 am 5. BPH-cut back cardura dose from 4mg -->2mg , and ultimately d/c.  Flomax used instead as less potential for hypotension  6. HLD-Continue Atorvastatin peri-op based on observational data this is cardio-protective 7. Unlikely UTI-Continue Unasyn for Intra-abdominal process [cholecystitis].  Abx to d/c 04/21/13   Code Status: Full Family Communication: discussed with patient.  Disposition Plan:  Inpatient    Pleas Koch, MD  [facility] Pager [phone] 04/22/2013, 11:24 AM    LOS: 4 days

## 2013-04-22 NOTE — Progress Notes (Signed)
Patient still without sensation to void despite clamped catheter. Notified Dr. Mahala Menghini. Will keep clamped until 1900, if patient does not develop sensation of having to void by then will release clamp and not discontinue foley at this time. Erskin Burnet RN

## 2013-04-22 NOTE — Progress Notes (Signed)
Patient at 1900 still did not have the urge to urinate despite clamped foley. Have released clamp and per Dr. Pandora Leiter earlier instructions, will discontinue the (discontinue foley order) for today. Erskin Burnet RN

## 2013-04-23 ENCOUNTER — Encounter (HOSPITAL_COMMUNITY): Payer: Self-pay | Admitting: Gastroenterology

## 2013-04-23 DIAGNOSIS — Z7901 Long term (current) use of anticoagulants: Secondary | ICD-10-CM

## 2013-04-23 DIAGNOSIS — I10 Essential (primary) hypertension: Secondary | ICD-10-CM

## 2013-04-23 LAB — COMPREHENSIVE METABOLIC PANEL
Alkaline Phosphatase: 251 U/L — ABNORMAL HIGH (ref 39–117)
BUN: 10 mg/dL (ref 6–23)
CO2: 26 mEq/L (ref 19–32)
Calcium: 8.2 mg/dL — ABNORMAL LOW (ref 8.4–10.5)
Chloride: 99 mEq/L (ref 96–112)
Creatinine, Ser: 1.21 mg/dL (ref 0.50–1.35)
GFR calc Af Amer: 59 mL/min — ABNORMAL LOW (ref >90)
Glucose, Bld: 64 mg/dL — ABNORMAL LOW (ref 70–99)
Potassium: 3.6 mEq/L (ref 3.5–5.1)
Total Bilirubin: 1.3 mg/dL — ABNORMAL HIGH (ref 0.3–1.2)

## 2013-04-23 LAB — PREPARE FRESH FROZEN PLASMA: Unit division: 0

## 2013-04-23 MED ORDER — DOXAZOSIN MESYLATE 2 MG PO TABS
2.0000 mg | ORAL_TABLET | Freq: Every day | ORAL | Status: DC
  Administered 2013-04-23: 2 mg via ORAL
  Filled 2013-04-23 (×3): qty 1

## 2013-04-23 MED ORDER — TAMSULOSIN HCL 0.4 MG PO CAPS
0.8000 mg | ORAL_CAPSULE | Freq: Every day | ORAL | Status: DC
  Administered 2013-04-23 – 2013-04-24 (×2): 0.8 mg via ORAL
  Filled 2013-04-23 (×3): qty 2

## 2013-04-23 NOTE — Evaluation (Signed)
Physical Therapy Evaluation Patient Details Name: Jeff Barrett MRN: [id number] DOB: [birth year] Today's Date: 04/23/2013 Time: 7829-5621 PT Time Calculation (min): 13 min  PT Assessment / Plan / Recommendation History of Present Illness  pt admitted 04/18/13 with cholecystolithiasis, s/p ERCP on 04/21/13  Clinical Impression  Pt tolerated ambulating with and without RW. Pt reports he walked earlier with RN. Pt will benefit fromPT while in acute care. Pt may benefit from HHPT safety eval.     PT Assessment  Patient needs continued PT services    Follow Up Recommendations  Home health PT    Does the patient have the potential to tolerate intense rehabilitation      Barriers to Discharge        Equipment Recommendations  None recommended by PT    Recommendations for Other Services     Frequency Min 3X/week    Precautions / Restrictions Precautions Precautions: Fall   Pertinent Vitals/Pain No c/o      Mobility  Bed Mobility Bed Mobility: Supine to Sit Supine to Sit: 7: Independent Transfers Transfers: Sit to Stand;Stand to Sit Sit to Stand: 4: Min guard;From bed;With upper extremity assist Stand to Sit: 5: Supervision;To chair/3-in-1;With upper extremity assist;With armrests Ambulation/Gait Ambulation/Gait Assistance: 5: Supervision Ambulation Distance (Feet): 400 Feet (ambulated last 200 ' without RW and supervision.) Assistive device: Rolling walker Ambulation/Gait Assistance Details: mild swaying without RW. Gait Pattern: Step-through pattern    Exercises     PT Diagnosis: Generalized weakness  PT Problem List: Decreased strength;Decreased balance;Decreased mobility;Decreased safety awareness PT Treatment Interventions: DME instruction;Gait training;Stair training;Functional mobility training;Therapeutic activities;Therapeutic exercise;Patient/family education     PT Goals(Current goals can be found in the care plan section) Acute Rehab PT Goals Patient  Stated Goal: I want to go home today. PT Goal Formulation: With patient Time For Goal Achievement: 05/07/13 Potential to Achieve Goals: Good  Visit Information  Last PT Received On: 04/23/13 History of Present Illness: pt admitted 04/18/13 with cholecystolithiasis, s/p ERCP on 04/21/13       Prior Functioning  Home Living Family/patient expects to be discharged to:: Private residence Living Arrangements: Spouse/significant other Available Help at Discharge: Family Type of Home: House Home Access: Stairs to enter Secretary/administrator of Steps: 6 Entrance Stairs-Rails: Right;Left Home Layout: Two level Alternate Level Stairs-Number of Steps: 6 Alternate Level Stairs-Rails: Right;Left Home Equipment: Walker - 2 wheels Prior Function Level of Independence: Independent Communication Communication: No difficulties    Cognition  Cognition Arousal/Alertness: Awake/alert Behavior During Therapy: WFL for tasks assessed/performed Overall Cognitive Status: Within Functional Limits for tasks assessed Memory: Decreased short-term memory;Decreased recall of precautions    Extremity/Trunk Assessment Upper Extremity Assessment Upper Extremity Assessment: Overall WFL for tasks assessed Lower Extremity Assessment Lower Extremity Assessment: Overall WFL for tasks assessed Cervical / Trunk Assessment Cervical / Trunk Assessment: Kyphotic   Balance    End of Session PT - End of Session Activity Tolerance: Patient tolerated treatment well Patient left: in chair;with call bell/phone within reach;with chair alarm set Nurse Communication: Mobility status  GP     Rada Hay 04/23/2013, 4:31 PM Blanchard Kelch PT [phone]

## 2013-04-23 NOTE — Progress Notes (Signed)
2 Days Post-Op  Subjective: He says he's feeling better, stomach is distended, he just got a regular diet.  He's hungry and wants to eat  He says he hasn't had BM,l but has had some flatus.  Objective: Vital signs in last 24 hours: Temp:  [98.4 F (36.9 C)-99.6 F (37.6 C)] 98.4 F (36.9 C) (09/22 0438) Pulse Rate:  [60-70] 70 (09/22 1101) Resp:  [16-18] 16 (09/22 0438) BP: (117-124)/(54-69) 124/69 mmHg (09/22 1101) SpO2:  [89 %-98 %] 96 % (09/22 0753) Weight:  [68.947 kg (152 lb)] 68.947 kg (152 lb) (09/22 0438) Last BM Date: 04/20/13 360 PO recorded,  Afebrile, VSS LFT's improving Back on Xareltro, Intake/Output from previous day: 09/21 0701 - 09/22 0700 In: 360 [P.O.:360] Out: 800 [Urine:800] Intake/Output this shift: Total I/O In: 360 [P.O.:360] Out: -   General appearance: alert, cooperative and no distress GI: soft, normal post op tenderness. he seems very distended.  BS hyperactive.  Lab Results:   Recent Labs  04/21/13 0555 04/22/13 0526  WBC 9.7 8.5  HGB 11.2* 11.3*  HCT 32.5* 31.2*  PLT 209 184    BMET  Recent Labs  04/22/13 0526 04/23/13 0520  NA 132* 131*  K 3.4* 3.6  CL 101 99  CO2 27 26  GLUCOSE 101* 64*  BUN 9 10  CREATININE 1.13 1.21  CALCIUM 8.3* 8.2*   PT/INR  Recent Labs  04/21/13 0555 04/22/13 0526  LABPROT 14.7 15.9*  INR 1.17 1.30     Recent Labs Lab 04/19/13 0420 04/20/13 0418 04/21/13 0555 04/22/13 0526 04/23/13 0520  AST 580* 287* 212* 226* 131*  ALT 385* 245* 191* 197* 131*  ALKPHOS 187* 170* 245* 313* 251*  BILITOT 2.1* 1.0 1.7* 2.8* 1.3*  PROT 6.0 5.7* 5.8* 6.1 5.6*  ALBUMIN 2.3* 2.1* 2.1* 2.1* 1.8*     Lipase     Component Value Date/Time   LIPASE 15 04/19/2013 0420     Studies/Results: No results found.  Medications: . atorvastatin  20 mg Oral Daily  . cholecalciferol  1,000 Units Oral Daily  . diltiazem  60 mg Oral Q12H  . doxazosin  2 mg Oral QHS  . flunisolide  2 spray Nasal BID  .  furosemide  20 mg Oral Daily  . Ipratropium-Albuterol  1 puff Inhalation TID  . lip balm  1 application Topical BID  . metoprolol succinate  25 mg Oral q morning - 10a  . multivitamin with minerals  1 tablet Oral Daily  . pantoprazole  40 mg Oral Daily  . psyllium  1 packet Oral BID  . rivaroxaban  15 mg Oral Daily  . saccharomyces boulardii  250 mg Oral BID  . sodium chloride  3 mL Intravenous Q12H  . tamsulosin  0.8 mg Oral QPC breakfast    Assessment/Plan 1. Abdominal pain, some nausea with Chronic cholelithiasis/cholecystitis  ERCP 04/20/13  Dr. Elnoria Howard Laparoscopic cholecystectomy 04/21/13  Dr. Karie Soda 2. Probable UTI  3. Hx of mild Aortic Stenosis  4. PAF ON Xarelto  5. Hx of Nephrolithiasis  6. Hx of tobacco use  7. Hyperlipidemia  8. Dizziness standing 1-2 time per week on Xarelto, BB and Cardizem. Last dose of Xarelto about 6PM yesterday.    Plan:  He is a bit distended i guess we will know more about how well he does after he eats this lunch.  He has been on pain medications with no BM since 9/19, not unreasonable for his GI tract to be slow.  I would be sure he is tolerating po's and had a BM before discharge.  I will put follow up information in AVS.        LOS: 5 days    Jeff Barrett 04/23/2013

## 2013-04-23 NOTE — Progress Notes (Addendum)
[facility]  [facility] is providing the following services: patient declined rw.  He has 2 at home.   If patient discharges after hours, please call [phone].   Renard Hamper 04/23/2013, 9:50 AM

## 2013-04-23 NOTE — Progress Notes (Signed)
Agree with the NP/PA-C note as written.  No additional suggestions.   Chrystie Nose, MD, [facility] Attending Cardiologist [facility]

## 2013-04-23 NOTE — Progress Notes (Signed)
Back on Xarelto, in SR one episode of SR with first degree AV block.  Pt without complaints.  I have made follow up appt. With pt in our office with Corine Shelter PA  Agree with note written by Nada Boozer RNP  NSR on Xarelto. F/U has been made in our office with [name]  Nanetta Batty J 04/23/2013 4:12 PM

## 2013-04-23 NOTE — Progress Notes (Signed)
Jeff Barrett:[id number] DOB: 1921/07/04 DOA: 04/18/2013 PCP: Thayer Headings, MD  Brief narrative: 79 yr old ? former [employer] vet , known h/o hypertension, PAF c Elevated CHADS2Vasc2 score on Xarelto, s/p Cardionet for Tachy-brady hyperlipidemia, mild aortic stenosis and chronic left lower extremity edema, with h/o Appendectomy 02/07/2009 by Dr. Dwain Sarna presented 9/17 with Acute abd pain. [place] Abd=Acute cholecystitis-LFTS markedly elevated, have trended down.  Nursing concerns about borderline hypotension, ? Hypoxia while sleeping.  Cardiology consulted by Gen surg regarding clearance and clearance obtained for cholecystectomy Gi consulted given elevated Bilbi, and MRCP 9/18 neg Was about to go to ERCP when had Chest tightness, probably related to transfusion of FFP on 9/19 Underwent Lap Chole 9/20 + IOC under Dr. Michaell Cowing 9/20-LFT's still elevated so kept overnight 9/21 Do to full diet 9/21 tolerated well  Past medical history-As per Problem list  Consultants:  Gen Surgery-[facility]erly  Cardiology-Hilty/Harding  GI-Hung  Procedures:  US abdomen  MRCP 9/18  Antibiotics:  Ciprofloxacin 9/17  Unasyn 9/18--> 9/21   Subjective  Doing fair. Patient on regular diet Passing flatus no stool however her ambulatory   Objective    Interim History: None   Telemetry: NSR per my auscultation   Objective: Filed Vitals:   04/23/13 0440 04/23/13 0753 04/23/13 1101 04/23/13 1500  BP:   124/69 114/60  Pulse:   70 69  Temp:    99.1 F (37.3 C)  TempSrc:    Oral  Resp:    18  Height:      Weight:      SpO2: 98% 96%  94%    Intake/Output Summary (Last 24 hours) at 04/23/13 1812 Last data filed at 04/23/13 1430  Gross per 24 hour  Intake    960 ml  Output   1200 ml  Net   -240 ml    Exam:  General: alert ,pleasant oriented in NAD Cardiovascular: s1 s2 NSR RRR Respiratory: clear Abdomen: soft, NT,slighlty distended, no rebound, no gaurd Skin soft, Trace edema Neuro  ROM intact  Data Reviewed: Basic Metabolic Panel:  Recent Labs Lab 04/19/13 0420 04/20/13 0418 04/21/13 0555 04/22/13 0526 04/23/13 0520  NA 137 138 135 132* 131*  K 4.1 3.9 3.8 3.4* 3.6  CL 103 105 104 101 99  CO2 29 28 27 27 26   GLUCOSE 79 90 108* 101* 64*  BUN 20 20 12 9 10   CREATININE 1.46* 1.57* 1.16 1.13 1.21  CALCIUM 8.7 8.4 8.1* 8.3* 8.2*   Liver Function Tests:  Recent Labs Lab 04/19/13 0420 04/20/13 0418 04/21/13 0555 04/22/13 0526 04/23/13 0520  AST 580* 287* 212* 226* 131*  ALT 385* 245* 191* 197* 131*  ALKPHOS 187* 170* 245* 313* 251*  BILITOT 2.1* 1.0 1.7* 2.8* 1.3*  PROT 6.0 5.7* 5.8* 6.1 5.6*  ALBUMIN 2.3* 2.1* 2.1* 2.1* 1.8*    Recent Labs Lab 04/18/13 0908 04/19/13 0420  LIPASE 36 15   No results found for this basename: AMMONIA,  in the last 168 hours CBC:  Recent Labs Lab 04/18/13 0908 04/19/13 0420 04/20/13 0418 04/21/13 0555 04/22/13 0526  WBC 9.7 8.1 7.1 9.7 8.5  NEUTROABS 8.1*  --   --   --  6.7  HGB 11.1* 9.9* 10.0* 11.2* 11.3*  HCT 31.5* 29.8* 29.2* 32.5* 31.2*  MCV 95.5 97.4 96.4 95.9 95.1  PLT 191 166 168 209 184   Cardiac Enzymes:  Recent Labs Lab 04/18/13 0908 04/20/13 1135  TROPONINI <0.30 <0.30   BNP: No components found  with this basename: POCBNP,  CBG:  Recent Labs Lab 04/21/13 1205 04/21/13 1657 04/21/13 2059 04/22/13 0035 04/22/13 0620  GLUCAP 105* 108* 141* 136* 98    Recent Results (from the past 240 hour(s))  URINE CULTURE     Status: None   Collection Time    04/18/13 10:23 AM      Result Value Range Status   Specimen Description URINE, CLEAN CATCH   Final   Special Requests NONE   Final   Culture  Setup Time     Final   Value: 04/18/2013 16:19     Performed at [facility] Count     Final   Value: NO GROWTH     Performed at [facility]   Culture     Final   Value: NO GROWTH     Performed at [facility]   Report Status 04/19/2013 FINAL   Final    SURGICAL PCR SCREEN     Status: None   Collection Time    04/20/13  4:42 AM      Result Value Range Status   MRSA, PCR NEGATIVE  NEGATIVE Final   Staphylococcus aureus NEGATIVE  NEGATIVE Final   Comment:            The Xpert SA Assay (FDA     approved for NASAL specimens     in patients over 7 years of age),     is one component of     a comprehensive surveillance     program.  Test performance has     been validated by [employer] for patients greater     than or equal to 34 year old.     It is not intended     to diagnose infection nor to     guide or monitor treatment.     Studies:              All Imaging reviewed and is as per above notation   Scheduled Meds: . atorvastatin  20 mg Oral Daily  . cholecalciferol  1,000 Units Oral Daily  . diltiazem  60 mg Oral Q12H  . doxazosin  2 mg Oral QHS  . flunisolide  2 spray Nasal BID  . furosemide  20 mg Oral Daily  . Ipratropium-Albuterol  1 puff Inhalation TID  . lip balm  1 application Topical BID  . metoprolol succinate  25 mg Oral q morning - 10a  . multivitamin with minerals  1 tablet Oral Daily  . pantoprazole  40 mg Oral Daily  . psyllium  1 packet Oral BID  . rivaroxaban  15 mg Oral Daily  . saccharomyces boulardii  250 mg Oral BID  . sodium chloride  3 mL Intravenous Q12H  . tamsulosin  0.8 mg Oral QPC breakfast   Continuous Infusions:     Assessment/Plan: 1. Acute Cholecystitis-General surgery kindly address  Antibiotics duration  Peri-op pain management/fluids-I have held his tylenol in view of LFT's  Resumption Anti-coagulation- Xarelto re-started 9/22    1. Hypotension-iatrogenic, 2/2 to B-blocker, Cardizem + Cardura-initially  Cardizem 240-->120mg  daily.   This was d/c completely 9/19 pm for bradycardia into the 30's-cardiology has transitioned his diltiazem on 9/21 to 60mg  twice a day.  He is to continue Toprol 25 mg daily-followup scheduled in the outpatient setting 2. Chest tightness  -patient was receiving FFP at the time. Potentially transfusion reaction however with his known atrial  fibrillation could have been from tachy?  Was MSK per Cards-has clean cath this year 2014 3. Hypoglycemia-continue D5-saline 75 cc till surgery-now d/c 4. H/o PAF-currently in NSR-note that patient has a slightly elevated INR 9/19 and 1.5 which is a prompted VitK 10 milligrams as well as FFP-Xarelto re-start 9/22 am 5. BPH-cut back cardura dose from 4mg -->2mg   Flomax used in addition has less potential for hypotension  6. HLD-Continue Atorvastatin peri-op based on observational data this is cardio-protective 7. Unlikely UTI-unasyn d/c 9/21   Code Status: Full Family Communication: discussed with patient.  Disposition Plan:  Inpatient    Pleas Koch, MD  [facility] Pager [phone] 04/23/2013, 6:12 PM    LOS: 5 days

## 2013-04-24 MED ORDER — BISACODYL 10 MG RE SUPP: 10.0000 mg | Freq: Two times a day (BID) | RECTAL | Status: DC | PRN

## 2013-04-24 MED ORDER — DILTIAZEM HCL 60 MG PO TABS: 60.0000 mg | ORAL_TABLET | Freq: Two times a day (BID) | ORAL | Status: DC

## 2013-04-24 MED ORDER — DOXAZOSIN MESYLATE 4 MG PO TABS: 2.0000 mg | ORAL_TABLET | Freq: Every day | ORAL | Status: AC

## 2013-04-24 NOTE — Progress Notes (Signed)
Pt ready for d/c. Went over d/c paperwork with pt and pt's wife and son. Instructed to make a follow-up appointment with the surgeon and his primary care provider. PIV removed, WNL. No change in pt condition since AM assessment. Pt d/c'd to home via wife and son. Eugene Garnet RN

## 2013-04-24 NOTE — Discharge Summary (Signed)
Physician Discharge Summary  Jeff Barrett DOB: [birth year] DOA: 04/18/2013  PCP: Thayer Headings, MD  Admit date: 04/18/2013 Discharge date: 04/24/2013  Time spent: 40 minutes   Recommendations for Outpatient Follow-up:  1. need outpatient followup appointment with Dr. gross of general surgery 2. Recommend complete blood count and Chem-12 in about one week  3. continue Zarelto 15 mg twice a day  Discharge Diagnoses:  Principal Problem:   Cholecystitis, acute Active Problems:   Paroxysmal atrial fibrillation   Essential hypertension   Hyperlipidemia   Aortic sclerosis   Diastolic dysfunction- grade 2   Chronic anticoagulation- Xarelto for PAF   Chronic renal insufficiency, stage III (moderate)   Discharge Condition: Good  Diet recommendation: Low-fat heart healthy  Filed Weights   04/22/13 0507 04/23/13 0438 04/24/13 0349  Weight: 63.7 kg (140 lb 6.9 oz) 68.947 kg (152 lb) 60.464 kg (133 lb 4.8 oz)    History of present illness:  79 yr old ? former [employer], [employer] , known h/o hypertension, PAF c Elevated CHADS2Vasc2 score on Xarelto, s/p Cardionet for Tachy-brady hyperlipidemia, mild aortic stenosis and chronic left lower extremity edema, with h/o Appendectomy 02/07/2009 by Dr. Dwain Sarna presented 9/17 with Acute abd pain.  [place] Abd=Acute cholecystitis-LFTS markedly elevated, have trended down.  Nursing concerns about borderline hypotension, ? Hypoxia while sleeping.  Cardiology consulted by Gen surg regarding clearance and clearance obtained for cholecystectomy  Gi consulted given elevated Bilbi, and MRCP 9/18 neg  Was about to go to ERCP when had Chest tightness, probably related to transfusion of FFP on 9/19  Underwent Lap Chole 9/20 + IOC under Dr. Michaell Cowing 9/20-LFT's still elevated so kept overnight 9/21   See below for full results and hospital course  Hospital Course:  :  1. Acute Cholecystitis-s/p Cholecystectomy 04/21/13 2. Hypotension-iatrogenic, 2/2 to  B-blocker, Cardizem + Cardura-initially Cardizem 240-->120mg  daily. This was d/c completely 9/19 pm for bradycardia into the 30's-cardiology then transitioned his diltiazem on 9/21 to 60mg  twice a day. He is to continue Toprol 25 mg daily-followup scheduled in the outpatient setting 3. Chest tightness -patient was receiving FFP at the time prior to ERCP by Dr. Elnoria Howard. Potentially transfusion reaction however with his known atrial fibrillation could have been from tachy? Was MSK per Cards-has clean cath this year 2014 4. Hypoglycemia-was only on clears at that time till surgery-resolved 5. H/o PAF-currently in NSR-note that patient has a slightly elevated INR 9/19 and 1.5 which is a prompted VitK 10 milligrams as well as FFP-Xarelto re-start 9/22 am 6. BPH-cut back cardura dose from 4mg -->2mg  Flomax used in addition has less potential for hypotension  7. HLD-Continued Atorvastatin peri-op based on observational data this is cardio-protective 8. Unlikely UTI-unasyn d/c 9/21  Consultants:  Gen Surgery-[facility]erly  Cardiology-Hilty/Harding  GI-Hung  Procedures:  US abdomen  MRCP 9/18  Antibiotics:  Ciprofloxacin 9/17  Unasyn 9/18--> 9/21   Discharge Exam: Filed Vitals:   04/24/13 0349  BP: 115/58  Pulse: 60  Temp: 98.3 F (36.8 C)  Resp: 20   Oriented, did not sleep well last night as that was uncomfortable Tolerating diet well had a large stool this morning which was hard Ambulatory No chest pain or shortness of breath  General:  EOMI, NCAT Cardiovascular: S1-S2 sinus rhythm Respiratory: Clinically clear Abdomen soft nontender  Discharge Instructions  Discharge Orders   Future Appointments Provider Department Dept Phone   05/01/2013 8:20 AM Abelino Derrick, PA-C [facility] [place] [phone]   Future Orders Complete  By Expires   Diet - low sodium heart healthy  As directed    Discharge instructions  As directed    Comments:     Fall with primary  care physician in about a week Postop surgical instructions as per general surgery-in general do not lift over 5-10 pounds until he see the general surgeon and do not wet the area copiously Continue her prior anticoagulation Please followup with her cardiologist as an outpatient    You were cared for by a hospitalist during your hospital stay. If you have any questions about your discharge medications or the care you received while you were in the hospital after you are discharged, you can call the unit and asked to speak with the hospitalist on call if the hospitalist that took care of you is not available. Once you are discharged, your primary care physician will handle any further medical issues. Please note that NO REFILLS for any discharge medications will be authorized once you are discharged, as it is imperative that you return to your primary care physician (or establish a relationship with a primary care physician if you do not have one) for your aftercare needs so that they can reassess your need for medications and monitor your lab values. If you do not have a primary care physician, you can call [phone] for a physician referral.   Increase activity slowly  As directed        Medication List         aspirin 81 MG EC tablet  Take 81 mg by mouth daily. Swallow whole.     atorvastatin 20 MG tablet  Commonly known as:  LIPITOR  Take 20 mg by mouth daily.     bisacodyl 10 MG suppository  Commonly known as:  DULCOLAX  Place 1 suppository (10 mg total) rectally every 12 (twelve) hours as needed.     cholecalciferol 1000 UNITS tablet  Commonly known as:  VITAMIN D  Take 1,000 Units by mouth daily.     COMBIVENT RESPIMAT 20-100 MCG/ACT Aers respimat  Generic drug:  Ipratropium-Albuterol  Inhale 1 puff into the lungs every 6 (six) hours.     diltiazem 240 MG 24 hr capsule  Commonly known as:  CARDIZEM CD  Take 1 capsule (240 mg total) by mouth daily.     diltiazem 60 MG tablet   Commonly known as:  CARDIZEM  Take 1 tablet (60 mg total) by mouth every 12 (twelve) hours.     doxazosin 4 MG tablet  Commonly known as:  CARDURA  Take 0.5 tablets (2 mg total) by mouth at bedtime.     fish oil-omega-3 fatty acids 1000 MG capsule  Take 1 g by mouth daily.     flunisolide 25 MCG/ACT (0.025%) Soln  Commonly known as:  NASALIDE  Inhale 2 sprays into the lungs 2 (two) times daily.     furosemide 20 MG tablet  Commonly known as:  LASIX  Take 20 mg by mouth daily.     metoprolol succinate 25 MG 24 hr tablet  Commonly known as:  TOPROL-XL  Take 25 mg by mouth every morning.     multivitamin tablet  Take 1 tablet by mouth daily.     omeprazole 20 MG capsule  Commonly known as:  PRILOSEC  Take 20 mg by mouth daily.     Rivaroxaban 15 MG Tabs tablet  Commonly known as:  XARELTO  Take 15 mg by mouth every morning.  traMADol 50 MG tablet  Commonly known as:  ULTRAM  Take 1-2 tablets (50-100 mg total) by mouth every 6 (six) hours as needed for pain.       No Known Allergies     Follow-up Information   Follow up with GROSS,STEVEN C., MD. Schedule an appointment as soon as possible for a visit in 2 weeks.   Specialty:  General Surgery   Contact information:   [address] [place] [address] [phone]       Follow up with Abelino Derrick, PA-C On 05/01/2013. (at 8:20 with [facility] )    Specialty:  Cardiology   Contact information:   [address] [place] [address] [phone]       Follow up with Thayer Headings, MD. (Keep regular scheduled appointment)    Specialty:  Internal Medicine   Contact information:   [address] [place] [address] [phone]        The results of significant diagnostics from this hospitalization (including imaging, microbiology, ancillary and laboratory) are listed below for reference.    Significant Diagnostic Studies: Dg Cholangiogram  Operative  04/21/2013   CLINICAL DATA:  Gallstones  EXAM: INTRAOPERATIVE CHOLANGIOGRAM  TECHNIQUE: Cholangiographic images from the C-arm fluoroscopic device were submitted for interpretation post-operatively. Please see the procedural report for the amount of contrast and the fluoroscopy time utilized.  COMPARISON:  None.  FINDINGS: Contrast fills the biliary tree to the level of the upper common bile duct. Contrast never passes into the distal common bile duct or duodenum. Contrast leakage is noted. Bile leak cannot be excluded.  IMPRESSION: The common bile duct is occluded. Bile leak cannot be excluded.   Electronically Signed   By: Maryclare Bean M.D.   On: 04/21/2013 10:03   US Abdomen Complete  04/18/2013   *RADIOLOGY REPORT*  Clinical Data:  The small bowel abdominal pain and elevated liver function tests.  COMPLETE ABDOMINAL ULTRASOUND  Comparison:  CT scan of the abdomen dated 02/07/2009  Findings:  Gallbladder:  The gallbladder is quite distended with the thickening of the gallbladderwall and pericholecystic fluid as well as sludge and stones in the gallbladder.  Findings are consistent with acute cholecystitis.  However, the patient has a negative sonographic Murphy's sign.  Common bile duct:  Common bile duct is 7.4 mm in diameter.  There is slight intrahepatic ductal dilatation.  No visible common bile duct stones.  Liver:  Normal slightly dilated intrahepatic ducts.  No focal mass lesions.  IVC:  Normal.  Pancreas:  The pancreas is normal except for dilatation of the pancreatic duct to 3.8 mm.  Spleen:  Normal.  And 7.2 cm in length.  Right Kidney:  10.9 cm in length.  Multiple renal calculi.  Left Kidney:  Normal.  9.0 cm in length.  Abdominal aorta:  Maximum diameter is approximately 3 cm at a focal bulge in the mid abdominal aorta.  IMPRESSION:  1.  Cholelithiasis with edematous gallbladder wall and pericholecystic fluid consistent with cholecystitis. 2.  Slightly dilated intra and extrahepatic bile  ducts and dilated pancreatic duct consistent with distal common bile duct obstruction.  No discrete stone is identified on this exam however. 3.  Multiple right renal calculi. 4.  Focal dilatation of the abdominal aorta to a diameter of 3 cm.   Original Report Authenticated By: Francene Boyers, M.D.   Mr 3d Recon At Scanner  04/20/2013   CLINICAL DATA:  Right upper quadrant abdominal pain with  elevated liver function tests.  EXAM: MRI ABDOMEN WITHOUT AND WITH CONTRAST (INCLUDING MRCP)  TECHNIQUE: Multiplanar multisequence MR imaging of the abdomen was performed both before and after the administration of intravenous contrast. Heavily T2-weighted images of the biliary and pancreatic ducts were obtained, and three-dimensional MRCP images were rendered by post processing.  CONTRAST:  7mL MULTIHANCE GADOBENATE DIMEGLUMINE 529 MG/ML IV SOLN  COMPARISON:  04/18/2013  FINDINGS: Despite efforts by the technologist and patient, motion artifact is present on today's exam and could not be eliminated. This reduces exam sensitivity and specificity.  Small bilateral pleural effusions with passive atelectasis. Mild right heart prominence.  T2 hyperintense 1.9 x 1.4 cm cyst in segment 8. Spleen and adrenal glands unremarkable.  Diffuse subcutaneous, mesenteric, and retroperitoneal edema. On image 22 of series 3 there is a 10 mm filling defect with low T2 and high precontrast T1 signal in the common hepatic duct or common bile duct. Possible dependent layering of smaller filling defects distal to this larger filling defect. I am uncertain if the filling defect enhances given the high precontrast T1 signal and motion artifact limitations of the exam. There also appear to be filling defects dependently in the gallbladder compatible with gallstones within the gallbladder. Gallbladder wall thickening is present with mild pericholecystic fluid.  The dorsal pancreatic duct borderline prominent. Bilateral renal upper pole benign cysts  noted.  There is dextroconvex lumbar scoliosis with spondylosis and degenerative disc disease.  The abdominal aorta is abnormal. There is an infrarenal saccular aneurysm with the abdominal aorta measuring 3.4 cm transverse and the saccular aneurysm with potential focal dissection measuring 1.3 cm in width along the right side of the abdominal aorta. There is also a focal dissection further caudad in the abdominal aorta. High precontrast T1 signal along the right side of the abdominal aorta in the vicinity of the right renal artery origin could represent a small acute intramural hematoma.  IMPRESSION: 1. Highly limited exam due to motion artifact, with the CBD nearly completely obscured on the dedicated MRCP images due to patient inability to suspend respiration or control motion. However, axial images suggest a 10 mm filling defect in the junction of the common hepatic duct and common bile duct with high T1 and low T2 signal characteristics. Possible similar smaller filling defects dependently in the distal CBD. The high T1 signal characteristics are unusual for stone but could represent subacute clot in the duct. Tumor is a less likely differential diagnostic consideration. Common bile duct caliber 10 mm, mildly dilated. 2. Diffuse subcutaneous, retroperitoneal, and mesenteric edema compatible with extensive 3rd spacing of fluid. Small bilateral pleural effusions. There is gallbladder wall thickening and mild pericholecystic fluid. Enlarged right heart. 3. Cholelithiasis 4. Abnormal abdominal aorta with suspected acute intramural hematoma in the vicinity of the right renal artery origin, focal dissection inferiorly, and a saccular infrarenal abdominal aortic aneurysm. 5. Hepatic and renal cysts. 6. Considerable dextroconvex lumbar scoliosis with rotary component.   Electronically Signed   By: Herbie Baltimore   On: 04/20/2013 08:35   Dg Ercp Biliary & Pancreatic Ducts  04/20/2013   *RADIOLOGY REPORT*  Clinical  Data: Bile duct stones.  Fluoroscopy time:  3 minutes 24 seconds.  ERCP  Comparison: MRCP dated 04/20/2013 and abdominal ultrasound dated 04/18/2013  Findings: Several images demonstrate filling defects in the common bile duct.  Slight dilatation of intra and extrahepatic bile ducts.  IMPRESSION: Common bile duct stones.   Original Report Authenticated By: Francene Boyers, M.D.   Mr  Abd W/wo Cm/mrcp  04/20/2013   CLINICAL DATA:  Right upper quadrant abdominal pain with elevated liver function tests.  EXAM: MRI ABDOMEN WITHOUT AND WITH CONTRAST (INCLUDING MRCP)  TECHNIQUE: Multiplanar multisequence MR imaging of the abdomen was performed both before and after the administration of intravenous contrast. Heavily T2-weighted images of the biliary and pancreatic ducts were obtained, and three-dimensional MRCP images were rendered by post processing.  CONTRAST:  7mL MULTIHANCE GADOBENATE DIMEGLUMINE 529 MG/ML IV SOLN  COMPARISON:  04/18/2013  FINDINGS: Despite efforts by the technologist and patient, motion artifact is present on today's exam and could not be eliminated. This reduces exam sensitivity and specificity.  Small bilateral pleural effusions with passive atelectasis. Mild right heart prominence.  T2 hyperintense 1.9 x 1.4 cm cyst in segment 8. Spleen and adrenal glands unremarkable.  Diffuse subcutaneous, mesenteric, and retroperitoneal edema. On image 22 of series 3 there is a 10 mm filling defect with low T2 and high precontrast T1 signal in the common hepatic duct or common bile duct. Possible dependent layering of smaller filling defects distal to this larger filling defect. I am uncertain if the filling defect enhances given the high precontrast T1 signal and motion artifact limitations of the exam. There also appear to be filling defects dependently in the gallbladder compatible with gallstones within the gallbladder. Gallbladder wall thickening is present with mild pericholecystic fluid.  The dorsal  pancreatic duct borderline prominent. Bilateral renal upper pole benign cysts noted.  There is dextroconvex lumbar scoliosis with spondylosis and degenerative disc disease.  The abdominal aorta is abnormal. There is an infrarenal saccular aneurysm with the abdominal aorta measuring 3.4 cm transverse and the saccular aneurysm with potential focal dissection measuring 1.3 cm in width along the right side of the abdominal aorta. There is also a focal dissection further caudad in the abdominal aorta. High precontrast T1 signal along the right side of the abdominal aorta in the vicinity of the right renal artery origin could represent a small acute intramural hematoma.  IMPRESSION: 1. Highly limited exam due to motion artifact, with the CBD nearly completely obscured on the dedicated MRCP images due to patient inability to suspend respiration or control motion. However, axial images suggest a 10 mm filling defect in the junction of the common hepatic duct and common bile duct with high T1 and low T2 signal characteristics. Possible similar smaller filling defects dependently in the distal CBD. The high T1 signal characteristics are unusual for stone but could represent subacute clot in the duct. Tumor is a less likely differential diagnostic consideration. Common bile duct caliber 10 mm, mildly dilated. 2. Diffuse subcutaneous, retroperitoneal, and mesenteric edema compatible with extensive 3rd spacing of fluid. Small bilateral pleural effusions. There is gallbladder wall thickening and mild pericholecystic fluid. Enlarged right heart. 3. Cholelithiasis 4. Abnormal abdominal aorta with suspected acute intramural hematoma in the vicinity of the right renal artery origin, focal dissection inferiorly, and a saccular infrarenal abdominal aortic aneurysm. 5. Hepatic and renal cysts. 6. Considerable dextroconvex lumbar scoliosis with rotary component.   Electronically Signed   By: Herbie Baltimore   On: 04/20/2013 08:35     Microbiology: Recent Results (from the past 240 hour(s))  URINE CULTURE     Status: None   Collection Time    04/18/13 10:23 AM      Result Value Range Status   Specimen Description URINE, CLEAN CATCH   Final   Special Requests NONE   Final   Culture  Setup Time  Final   Value: 04/18/2013 16:19     Performed at [facility] Count     Final   Value: NO GROWTH     Performed at [facility]   Culture     Final   Value: NO GROWTH     Performed at [facility]   Report Status 04/19/2013 FINAL   Final  SURGICAL PCR SCREEN     Status: None   Collection Time    04/20/13  4:42 AM      Result Value Range Status   MRSA, PCR NEGATIVE  NEGATIVE Final   Staphylococcus aureus NEGATIVE  NEGATIVE Final   Comment:            The Xpert SA Assay (FDA     approved for NASAL specimens     in patients over 23 years of age),     is one component of     a comprehensive surveillance     program.  Test performance has     been validated by [employer] for patients greater     than or equal to 67 year old.     It is not intended     to diagnose infection nor to     guide or monitor treatment.     Labs: Basic Metabolic Panel:  Recent Labs Lab 04/19/13 0420 04/20/13 0418 04/21/13 0555 04/22/13 0526 04/23/13 0520  NA 137 138 135 132* 131*  K 4.1 3.9 3.8 3.4* 3.6  CL 103 105 104 101 99  CO2 29 28 27 27 26   GLUCOSE 79 90 108* 101* 64*  BUN 20 20 12 9 10   CREATININE 1.46* 1.57* 1.16 1.13 1.21  CALCIUM 8.7 8.4 8.1* 8.3* 8.2*   Liver Function Tests:  Recent Labs Lab 04/19/13 0420 04/20/13 0418 04/21/13 0555 04/22/13 0526 04/23/13 0520  AST 580* 287* 212* 226* 131*  ALT 385* 245* 191* 197* 131*  ALKPHOS 187* 170* 245* 313* 251*  BILITOT 2.1* 1.0 1.7* 2.8* 1.3*  PROT 6.0 5.7* 5.8* 6.1 5.6*  ALBUMIN 2.3* 2.1* 2.1* 2.1* 1.8*    Recent Labs Lab 04/18/13 0908 04/19/13 0420  LIPASE 36 15   No results found for this basename: AMMONIA,   in the last 168 hours CBC:  Recent Labs Lab 04/18/13 0908 04/19/13 0420 04/20/13 0418 04/21/13 0555 04/22/13 0526  WBC 9.7 8.1 7.1 9.7 8.5  NEUTROABS 8.1*  --   --   --  6.7  HGB 11.1* 9.9* 10.0* 11.2* 11.3*  HCT 31.5* 29.8* 29.2* 32.5* 31.2*  MCV 95.5 97.4 96.4 95.9 95.1  PLT 191 166 168 209 184   Cardiac Enzymes:  Recent Labs Lab 04/18/13 0908 04/20/13 1135  TROPONINI <0.30 <0.30   BNP: BNP (last 3 results) No results found for this basename: PROBNP,  in the last 8760 hours CBG:  Recent Labs Lab 04/21/13 1205 04/21/13 1657 04/21/13 2059 04/22/13 0035 04/22/13 0620  GLUCAP 105* 108* 141* 136* 98       Signed:  Aswad Wandrey, JAI-GURMUKH  [facility] 04/24/2013, 10:50 AM

## 2013-04-24 NOTE — Progress Notes (Signed)
3 Days Post-Op  Subjective: He is tolerating a regular diet and told me he just had a BM.  York Spaniel it was rough,but doing well.  He walked yesterday, hasn't walked to much today.  Objective: Vital signs in last 24 hours: Temp:  [98.3 F (36.8 C)-99.1 F (37.3 C)] 98.3 F (36.8 C) (09/23 0349) Pulse Rate:  [60-81] 60 (09/23 0349) Resp:  [18-20] 20 (09/23 0349) BP: (111-124)/(58-69) 115/58 mmHg (09/23 0349) SpO2:  [93 %-95 %] 95 % (09/23 0349) Weight:  [60.464 kg (133 lb 4.8 oz)] 60.464 kg (133 lb 4.8 oz) (09/23 0349) Last BM Date: 04/20/13  Intake/Output from previous day: 09/22 0701 - 09/23 0700 In: 600 [P.O.:600] Out: 800 [Urine:800] Intake/Output this shift:    General appearance: alert, cooperative and no distress GI: soft, still a little distended, incisions look fine +BS, +BM  Lab Results:   Recent Labs  04/22/13 0526  WBC 8.5  HGB 11.3*  HCT 31.2*  PLT 184    BMET  Recent Labs  04/22/13 0526 04/23/13 0520  NA 132* 131*  K 3.4* 3.6  CL 101 99  CO2 27 26  GLUCOSE 101* 64*  BUN 9 10  CREATININE 1.13 1.21  CALCIUM 8.3* 8.2*   PT/INR  Recent Labs  04/22/13 0526  LABPROT 15.9*  INR 1.30     Recent Labs Lab 04/19/13 0420 04/20/13 0418 04/21/13 0555 04/22/13 0526 04/23/13 0520  AST 580* 287* 212* 226* 131*  ALT 385* 245* 191* 197* 131*  ALKPHOS 187* 170* 245* 313* 251*  BILITOT 2.1* 1.0 1.7* 2.8* 1.3*  PROT 6.0 5.7* 5.8* 6.1 5.6*  ALBUMIN 2.3* 2.1* 2.1* 2.1* 1.8*     Lipase     Component Value Date/Time   LIPASE 15 04/19/2013 0420     Studies/Results: No results found.  Medications: . atorvastatin  20 mg Oral Daily  . cholecalciferol  1,000 Units Oral Daily  . diltiazem  60 mg Oral Q12H  . doxazosin  2 mg Oral QHS  . flunisolide  2 spray Nasal BID  . furosemide  20 mg Oral Daily  . Ipratropium-Albuterol  1 puff Inhalation TID  . lip balm  1 application Topical BID  . metoprolol succinate  25 mg Oral q morning - 10a  .  multivitamin with minerals  1 tablet Oral Daily  . pantoprazole  40 mg Oral Daily  . psyllium  1 packet Oral BID  . rivaroxaban  15 mg Oral Daily  . saccharomyces boulardii  250 mg Oral BID  . sodium chloride  3 mL Intravenous Q12H  . tamsulosin  0.8 mg Oral QPC breakfast    Assessment/Plan . Abdominal pain, some nausea with Chronic cholelithiasis/cholecystitis  ERCP 04/20/13 Dr. Elnoria Howard  Laparoscopic cholecystectomy 04/21/13 Dr. Karie Soda  2. Probable UTI  3. Hx of mild Aortic Stenosis  4. PAF ON Xarelto  5. Hx of Nephrolithiasis  6. Hx of tobacco use  7. Hyperlipidemia  8. Dizziness standing 1-2 time per week on Xarelto, BB and Cardizem. Last dose of Xarelto about 6PM yesterday.    Plan:  From our standpoint he can go when medically ready.  I will put D/C instructions and follow up in AVS.      LOS: 6 days    Sophonie Goforth 04/24/2013

## 2013-04-25 ENCOUNTER — Telehealth: Payer: Self-pay | Admitting: Cardiology

## 2013-04-25 ENCOUNTER — Telehealth (INDEPENDENT_AMBULATORY_CARE_PROVIDER_SITE_OTHER): Payer: Self-pay

## 2013-04-25 NOTE — Telephone Encounter (Signed)
Returned call.  Pt put son, Dorene Sorrow, on phone.  Son stated pt discharged w/ diltiazem 60 mg and was already taking 240 mg.  Wants to know what pt is supposed to be taking.  RN reviewed discharge note and pt was discharged on both.  Per note, pt to call unit he was on to talk w/ hospitalist or page hospitalist on-call w/ questions about discharge medications.  Son informed of this and RN reviewed discharge summary w/ him.  RN attempted to notify L. Diona Fanti, PA-C since pt has an appt next week w/ him, but he had already left the office for the day.  Son informed and advised to contact unit pt was on and page hospitalist.  Verbalized understanding and agreed w/ plan.

## 2013-04-25 NOTE — Telephone Encounter (Signed)
Pts son called wanting to know about dosage of current cardiac meds. I advised him to call Dr Benay Spice office, the pts cardiologist with any questions about heart meds and dosages. He states he understands and will call Dr Benay Spice office.

## 2013-04-25 NOTE — Telephone Encounter (Signed)
Has questions about a medication that was given to him by Corine Shelter, PA.  Patient was discharged from the hospital over the weekend after gall bladder surgery.

## 2013-04-25 NOTE — Progress Notes (Signed)
Pt's son called questioning medication discharged on yesterday.  The home dose of Cardizem 240mg  daily and new RX of Cardizem 60mg  BID ordered.  Discussed with patient's son and read discharge note to son.  Discussed with Dr. Darnelle Catalan, as Dr. Mahala Menghini not on today, and determined that patient should be on the Cardizem 60mg  po BID at present time.  Called patient back and relayed information.  Patient verbalized understanding and very thankful for the return call. Nino Parsley

## 2013-05-01 ENCOUNTER — Encounter: Payer: Self-pay | Admitting: Cardiology

## 2013-05-01 ENCOUNTER — Other Ambulatory Visit: Payer: Self-pay | Admitting: *Deleted

## 2013-05-01 ENCOUNTER — Ambulatory Visit (INDEPENDENT_AMBULATORY_CARE_PROVIDER_SITE_OTHER): Payer: Medicare Other | Admitting: Cardiology

## 2013-05-01 VITALS — BP 100/60 | HR 57 | Ht 67.5 in | Wt 142.0 lb

## 2013-05-01 DIAGNOSIS — I480 Paroxysmal atrial fibrillation: Secondary | ICD-10-CM

## 2013-05-01 DIAGNOSIS — I10 Essential (primary) hypertension: Secondary | ICD-10-CM

## 2013-05-01 DIAGNOSIS — K810 Acute cholecystitis: Secondary | ICD-10-CM

## 2013-05-01 DIAGNOSIS — I471 Supraventricular tachycardia: Secondary | ICD-10-CM

## 2013-05-01 DIAGNOSIS — Z7901 Long term (current) use of anticoagulants: Secondary | ICD-10-CM

## 2013-05-01 DIAGNOSIS — I4891 Unspecified atrial fibrillation: Secondary | ICD-10-CM

## 2013-05-01 MED ORDER — DILTIAZEM HCL ER COATED BEADS 120 MG PO CP24: 240.0000 mg | ORAL_CAPSULE | Freq: Every day | ORAL | Status: DC

## 2013-05-01 MED ORDER — DILTIAZEM HCL ER COATED BEADS 120 MG PO CP24: 120.0000 mg | ORAL_CAPSULE | Freq: Every day | ORAL | Status: DC

## 2013-05-01 NOTE — Assessment & Plan Note (Signed)
Resumed post op

## 2013-05-01 NOTE — Assessment & Plan Note (Signed)
B/P low this am- 92/60

## 2013-05-01 NOTE — Assessment & Plan Note (Signed)
Seen post hospital

## 2013-05-01 NOTE — Assessment & Plan Note (Signed)
Holding NSR, SB 

## 2013-05-01 NOTE — Patient Instructions (Addendum)
Stop Diltiazem 60mg  twice daily. ONLY TAKE DILTIAZEM 120mg  ONCE DAILY.  Your physician wants you to follow-up in: 6 months with Dr. Allyson Sabal. You will receive a reminder letter in the mail two months in advance. If you don't receive a letter, please call our office to schedule the follow-up appointment.

## 2013-05-01 NOTE — Progress Notes (Signed)
05/01/2013 Jeff Barrett   01-06-1921  [id number]  Primary Physicia Thayer Headings, MD Primary Cardiologist: Dr Allyson Sabal  HPI:  Jeff Barrett is a pleasant 78 y/o who is here today as a post hospital visit after he had a lap chole 04/21/13. He is here with his wife of 71 yrs. 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 Last year his son took the family to tour [place] and the battlefields. He has a past medical history significant for HTN, dyslipidemia, and PAF which was noted this past spring. He had been holding NSR on Diltiazem. His LOV was June 2014. He is on Xarelto. He has no history of CAD or MI. A Myoview done in June 2011 was low risk. Echo in June 2014 showed an EF of 55-65% with grade 2 diastolic dysfunction. 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 We saw him peri-op. He did have some bradycardia and hypotension pre-op and his Diltiazem and Cardura were cut back. His Xarelto was held for 48 hrs pre op. Post op he did well, I believe he had some tachycardia early post op. For some reason at discharge he was sent home on his original dose of Diltiazem 240 mg and Diltiazem 60 mg BID. Fortunately he has not had any syncope. He has been sore but otherwise doing well.   Current Outpatient Prescriptions  Medication Sig Dispense Refill  . aspirin 81 MG EC tablet Take 81 mg by mouth daily. Swallow whole.      Marland Kitchen atorvastatin (LIPITOR) 20 MG tablet Take 20 mg by mouth daily.      . bisacodyl (DULCOLAX) 10 MG suppository Place 1 suppository (10 mg total) rectally every 12 (twelve) hours as needed.  12 suppository  0  . cholecalciferol (VITAMIN D) 1000 UNITS tablet Take 1,000 Units by mouth daily.      Marland Kitchen diltiazem (CARDIZEM CD) 120 MG 24 hr capsule Take 1 capsule (120 mg total) by mouth daily.  90 capsule  3  . doxazosin (CARDURA) 4 MG tablet Take 0.5 tablets (2 mg total) by mouth at bedtime.      . fish oil-omega-3 fatty acids 1000 MG capsule  Take 1 g by mouth daily.      . flunisolide (NASALIDE) 25 MCG/ACT (0.025%) SOLN Inhale 2 sprays into the lungs 2 (two) times daily.      . furosemide (LASIX) 20 MG tablet Take 20 mg by mouth daily.       . Ipratropium-Albuterol (COMBIVENT RESPIMAT) 20-100 MCG/ACT AERS respimat Inhale 1 puff into the lungs every 6 (six) hours.      . metoprolol succinate (TOPROL-XL) 25 MG 24 hr tablet Take 25 mg by mouth every morning.      . Multiple Vitamin (MULTIVITAMIN) tablet Take 1 tablet by mouth daily.      Marland Kitchen omeprazole (PRILOSEC) 20 MG capsule Take 20 mg by mouth daily.      . Rivaroxaban (XARELTO) 15 MG TABS tablet Take 15 mg by mouth every morning.      . traMADol (ULTRAM) 50 MG tablet Take 1-2 tablets (50-100 mg total) by mouth every 6 (six) hours as needed for pain.  30 tablet  1   No current facility-administered medications for this visit.    No Known Allergies  History   Social History  . Marital Status: Married    Spouse Name: N/A    Number of Children: N/A  . Years of Education:  N/A   Occupational History  . Not on file.   Social History Main Topics  . Smoking status: Former Smoker    Quit date: 04/04/1969  . Smokeless tobacco: Former Neurosurgeon    Quit date: 04/04/1969  . Alcohol Use: Yes     Comment: occasional - wine  . Drug Use: No  . Sexual Activity: No   Other Topics Concern  . Not on file   Social History Narrative  . No narrative on file     Review of Systems: General: negative for chills, fever, night sweats or weight changes.  Cardiovascular: negative for chest pain, dyspnea on exertion, edema, orthopnea, palpitations, paroxysmal nocturnal dyspnea or shortness of breath Dermatological: negative for rash Respiratory: negative for cough or wheezing Urologic: negative for hematuria Abdominal: negative for nausea, vomiting, diarrhea, bright red blood per rectum, melena, or hematemesis Neurologic: negative for visual changes, syncope, or dizziness All other systems  reviewed and are otherwise negative except as noted above.    Blood pressure 100/60, pulse 57, height 5' 7.5" (1.715 m), weight 142 lb (64.411 kg).  General appearance: alert, cooperative and no distress Lungs: clear to auscultation bilaterally and kyphoscoliosis Heart: regular rate and rhythm and soft systolic murmur  EKG NSR-SB  ASSESSMENT AND PLAN:   Cholecystitis, acute- s/p Lap chole 04/21/13 Seen post hospital  Paroxysmal atrial fibrillation Holding NSR, SB  Essential hypertension B/P low this am- 92/60  Chronic anticoagulation- Xarelto for PAF Resumed post op   PLAN  Decrease Diltiazem to his pre-op dose of 120 mg daily. Stop Diltiazem 60 mg BID that he was discharged on. Follow up with Dr Allyson Sabal in 6  Months.  Jeff Barrett KPA-C 05/01/2013 9:35 AM

## 2013-05-09 ENCOUNTER — Ambulatory Visit (INDEPENDENT_AMBULATORY_CARE_PROVIDER_SITE_OTHER): Payer: Medicare Other | Admitting: Surgery

## 2013-05-09 ENCOUNTER — Encounter (INDEPENDENT_AMBULATORY_CARE_PROVIDER_SITE_OTHER): Payer: Self-pay | Admitting: Surgery

## 2013-05-09 VITALS — BP 104/66 | HR 68 | Temp 98.2°F | Resp 14 | Ht 67.5 in | Wt 138.4 lb

## 2013-05-09 DIAGNOSIS — K810 Acute cholecystitis: Secondary | ICD-10-CM

## 2013-05-09 DIAGNOSIS — K5900 Constipation, unspecified: Secondary | ICD-10-CM

## 2013-05-09 DIAGNOSIS — K5909 Other constipation: Secondary | ICD-10-CM | POA: Insufficient documentation

## 2013-05-09 NOTE — Patient Instructions (Signed)
LAPAROSCOPIC SURGERY: POST OP INSTRUCTIONS  1. DIET: Follow a light bland diet the first 24 hours after arrival home, such as soup, liquids, crackers, etc.  Be sure to include lots of fluids daily.  Avoid fast food or heavy meals as your are more likely to get nauseated.  Eat a low fat the next few days after surgery.   2. Take your usually prescribed home medications unless otherwise directed. 3. PAIN CONTROL: a. Pain is best controlled by a usual combination of three different methods TOGETHER: i. Ice/Heat ii. Over the counter pain medication iii. Prescription pain medication b. Most patients will experience some swelling and bruising around the incisions.  Ice packs or heating pads (30-60 minutes up to 6 times a day) will help. Use ice for the first few days to help decrease swelling and bruising, then switch to heat to help relax tight/sore spots and speed recovery.  Some people prefer to use ice alone, heat alone, alternating between ice & heat.  Experiment to what works for you.  Swelling and bruising can take several weeks to resolve.   c. It is helpful to take an over-the-counter pain medication regularly for the first few weeks.  Choose one of the following that works best for you: i. Naproxen (Aleve, etc)  Two 220mg tabs twice a day ii. Ibuprofen (Advil, etc) Three 200mg tabs four times a day (every meal & bedtime) iii. Acetaminophen (Tylenol, etc) 500-650mg four times a day (every meal & bedtime) d. A  prescription for pain medication (such as oxycodone, hydrocodone, etc) should be given to you upon discharge.  Take your pain medication as prescribed.  i. If you are having problems/concerns with the prescription medicine (does not control pain, nausea, vomiting, rash, itching, etc), please call us [phone] to see if we need to switch you to a different pain medicine that will work better for you and/or control your side effect better. ii. If you need a refill on your pain medication,  please contact your pharmacy.  They will contact our office to request authorization. Prescriptions will not be filled after 5 pm or on week-ends. 4. Avoid getting constipated.  Between the surgery and the pain medications, it is common to experience some constipation.  Increasing fluid intake and taking a fiber supplement (such as Metamucil, Citrucel, FiberCon, MiraLax, etc) 1-2 times a day regularly will usually help prevent this problem from occurring.  A mild laxative (prune juice, Milk of Magnesia, MiraLax, etc) should be taken according to package directions if there are no bowel movements after 48 hours.   5. Watch out for diarrhea.  If you have many loose bowel movements, simplify your diet to bland foods & liquids for a few days.  Stop any stool softeners and decrease your fiber supplement.  Switching to mild anti-diarrheal medications (Kayopectate, Pepto Bismol) can help.  If this worsens or does not improve, please call us. 6. Wash / shower every day.  You may shower over the dressings as they are waterproof.  Continue to shower over incision(s) after the dressing is off. 7. Remove your waterproof bandages 5 days after surgery.  You may leave the incision open to air.  You may replace a dressing/Band-Aid to cover the incision for comfort if you wish.  8. ACTIVITIES as tolerated:   a. You may resume regular (light) daily activities beginning the next day-such as daily self-care, walking, climbing stairs-gradually increasing activities as tolerated.  If you can walk 30 minutes without difficulty, it   is safe to try more intense activity such as jogging, treadmill, bicycling, low-impact aerobics, swimming, etc. b. Save the most intensive and strenuous activity for last such as sit-ups, heavy lifting, contact sports, etc  Refrain from any heavy lifting or straining until you are off narcotics for pain control.   c. DO NOT PUSH THROUGH PAIN.  Let pain be your guide: If it hurts to do something, don't do  it.  Pain is your body warning you to avoid that activity for another week until the pain goes down. d. You may drive when you are no longer taking prescription pain medication, you can comfortably wear a seatbelt, and you can safely maneuver your car and apply brakes. e. You may have sexual intercourse when it is comfortable.  9. FOLLOW UP in our office a. Please call [facility] at [phone] to set up an appointment to see your surgeon in the office for a follow-up appointment approximately 2-3 weeks after your surgery. b. Make sure that you call for this appointment the day you arrive home to insure a convenient appointment time. 10. IF YOU HAVE DISABILITY OR FAMILY LEAVE FORMS, BRING THEM TO THE OFFICE FOR PROCESSING.  DO NOT GIVE THEM TO YOUR DOCTOR.   WHEN TO CALL US [phone]: 1. Poor pain control 2. Reactions / problems with new medications (rash/itching, nausea, etc)  3. Fever over 101.5 F (38.5 C) 4. Inability to urinate 5. Nausea and/or vomiting 6. Worsening swelling or bruising 7. Continued bleeding from incision. 8. Increased pain, redness, or drainage from the incision   The clinic staff is available to answer your questions during regular business hours (8:30am-5pm).  Please don't hesitate to call and ask to speak to one of our nurses for clinical concerns.   If you have a medical emergency, go to the nearest emergency room or call 911.  A surgeon from [facility] is always on call at the hospitals   [facility] [address], [address], [place], [place]  [address] ? MAIN: [phone] ? TOLL FREE: [phone] ?  FAX [phone] [email or web address]  GETTING TO GOOD BOWEL HEALTH. Irregular bowel habits such as constipation and diarrhea can lead to many problems over time.  Having one soft bowel movement a day is the most important way to prevent further problems.  The anorectal canal is designed to handle stretching  and feces to safely manage our ability to get rid of solid waste (feces, poop, stool) out of our body.  BUT, hard constipated stools can act like ripping concrete bricks and diarrhea can be a burning fire to this very sensitive area of our body, causing inflamed hemorrhoids, anal fissures, increasing risk is perirectal abscesses, abdominal pain/bloating, an making irritable bowel worse.     The goal: ONE SOFT BOWEL MOVEMENT A DAY!  To have soft, regular bowel movements:    Drink at least 8 tall glasses of water a day.     Take plenty of fiber.  Fiber is the undigested part of plant food that passes into the colon, acting s "natures broom" to encourage bowel motility and movement.  Fiber can absorb and hold large amounts of water. This results in a larger, bulkier stool, which is soft and easier to pass. Work gradually over several weeks up to 6 servings a day of fiber (25g a day even more if needed) in the form of: o Vegetables -- Root (potatoes, carrots, turnips), leafy green (lettuce, salad greens, celery,   spinach), or cooked high residue (cabbage, broccoli, etc) o Fruit -- Fresh (unpeeled skin & pulp), Dried (prunes, apricots, cherries, etc ),  or stewed ( applesauce)  o Whole grain breads, pasta, etc (whole wheat)  o Bran cereals    Bulking Agents -- This type of water-retaining fiber generally is easily obtained each day by one of the following:  o Psyllium bran -- The psyllium plant is remarkable because its ground seeds can retain so much water. This product is available as Metamucil, Konsyl, Effersyllium, Per Diem Fiber, or the less expensive generic preparation in drug and health food stores. Although labeled a laxative, it really is not a laxative.  o Methylcellulose -- This is another fiber derived from wood which also retains water. It is available as Citrucel. o Polyethylene Glycol - and "artificial" fiber commonly called Miralax or Glycolax.  It is helpful for people with gassy or bloated  feelings with regular fiber o Flax Seed - a less gassy fiber than psyllium   No reading or other relaxing activity while on the toilet. If bowel movements take longer than 5 minutes, you are too constipated   AVOID CONSTIPATION.  High fiber and water intake usually takes care of this.  Sometimes a laxative is needed to stimulate more frequent bowel movements, but    Laxatives are not a good long-term solution as it can wear the colon out. o Osmotics (Milk of Magnesia, Fleets phosphosoda, Magnesium citrate, MiraLax, GoLytely) are safer than  o Stimulants (Senokot, Castor Oil, Dulcolax, Ex Lax)    o Do not take laxatives for more than 7days in a row.    IF SEVERELY CONSTIPATED, try a Bowel Retraining Program: o Do not use laxatives.  o Eat a diet high in roughage, such as bran cereals and leafy vegetables.  o Drink six (6) ounces of prune or apricot juice each morning.  o Eat two (2) large servings of stewed fruit each day.  o Take one (1) heaping tablespoon of a psyllium-based bulking agent twice a day. Use sugar-free sweetener when possible to avoid excessive calories.  o Eat a normal breakfast.  o Set aside 15 minutes after breakfast to sit on the toilet, but do not strain to have a bowel movement.  o If you do not have a bowel movement by the third day, use an enema and repeat the above steps.    Controlling diarrhea o Switch to liquids and simpler foods for a few days to avoid stressing your intestines further. o Avoid dairy products (especially milk & ice cream) for a short time.  The intestines often can lose the ability to digest lactose when stressed. o Avoid foods that cause gassiness or bloating.  Typical foods include beans and other legumes, cabbage, broccoli, and dairy foods.  Every person has some sensitivity to other foods, so listen to our body and avoid those foods that trigger problems for you. o Adding fiber (Citrucel, Metamucil, psyllium, Miralax) gradually can help thicken  stools by absorbing excess fluid and retrain the intestines to act more normally.  Slowly increase the dose over a few weeks.  Too much fiber too soon can backfire and cause cramping & bloating. o Probiotics (such as active yogurt, Align, etc) may help repopulate the intestines and colon with normal bacteria and calm down a sensitive digestive tract.  Most studies show it to be of mild help, though, and such products can be costly. o Medicines:   Bismuth subsalicylate (ex. Kayopectate, Pepto Bismol) every   30 minutes for up to 6 doses can help control diarrhea.  Avoid if pregnant.   Loperamide (Immodium) can slow down diarrhea.  Start with two tablets (4mg total) first and then try one tablet every 6 hours.  Avoid if you are having fevers or severe pain.  If you are not better or start feeling worse, stop all medicines and call your doctor for advice o Call your doctor if you are getting worse or not better.  Sometimes further testing (cultures, endoscopy, X-ray studies, bloodwork, etc) may be needed to help diagnose and treat the cause of the diarrhea.  Exercise to Stay Healthy Exercise helps you become and stay healthy. EXERCISE IDEAS AND TIPS Choose exercises that:  You enjoy.  Fit into your day. You do not need to exercise really hard to be healthy. You can do exercises at a slow or medium level and stay healthy. You can:  Stretch before and after working out.  Try yoga, Pilates, or tai chi.  Lift weights.  Walk fast, swim, jog, run, climb stairs, bicycle, dance, or rollerskate.  Take aerobic classes. Exercises that burn about 150 calories:  Running 1  miles in 15 minutes.  Playing volleyball for 45 to 60 minutes.  Washing and waxing a car for 45 to 60 minutes.  Playing touch football for 45 minutes.  Walking 1  miles in 35 minutes.  Pushing a stroller 1  miles in 30 minutes.  Playing basketball for 30 minutes.  Raking leaves for 30 minutes.  Bicycling 5 miles in 30  minutes.  Walking 2 miles in 30 minutes.  Dancing for 30 minutes.  Shoveling snow for 15 minutes.  Swimming laps for 20 minutes.  Walking up stairs for 15 minutes.  Bicycling 4 miles in 15 minutes.  Gardening for 30 to 45 minutes.  Jumping rope for 15 minutes.  Washing windows or floors for 45 to 60 minutes. Document Released: 08/21/2010 Document Revised: 10/11/2011 Document Reviewed: 08/21/2010 [employer] Patient Information 2014 [employer].  

## 2013-05-09 NOTE — Progress Notes (Signed)
Subjective:     Patient ID: Jeff Barrett, male   DOB: 15-Dec-1920, 79 y.o.   MRN: [id number]  HPI  Jeff Barrett  12-17-1920 [id number]  Patient Care Team: Thayer Headings, MD as PCP - General (Internal Medicine) Theda Belfast, MD as Consulting Physician (Gastroenterology)  Procedure (Date: 04/21/2013):  POST-OPERATIVE DIAGNOSIS:  Choledocholithiasis status post ERCP  Chronic cholecystitis with cholecystolithiasis   PROCEDURE: Procedure(s):  LAPAROSCOPIC CHOLECYSTECTOMY WITH INTRAOPERATIVE CHOLANGIOGRAM  SURGEON: Surgeon(s):  Ardeth Sportsman, MD   This patient returns for surgical re-evaluation.  He is doing relatively well.  Eating most foods.  Appetite improving although not totally normal.  He is off narcotics.  Not taking a pain medications.  He wonders what is okay to eat.  He wants to get back to driving.  No fevers or chills.    Patient Active Problem List   Diagnosis Date Noted  . Constipation, chronic 05/09/2013  . Protein-calorie malnutrition, severe 04/20/2013  . Cholecystitis, acute- s/p Lap chole 04/21/13 04/18/2013  . Aortic sclerosis 04/18/2013  . Diastolic dysfunction- grade 2 78/29/5621  . Chronic anticoagulation- Xarelto for PAF 04/18/2013  . Chronic renal insufficiency, stage III (moderate) 04/18/2013  . Paroxysmal atrial fibrillation 12/19/2012  . Essential hypertension 12/19/2012  . Hyperlipidemia 12/19/2012    Past Medical History  Diagnosis Date  . Shortness of breath on exertion 01/15/2010    2D Echo - EF >55%; Nuclear Stress Test 01/14/2010 - EKG negative for ischemia, normal myocardial perfusion study  . Hypertension   . Hyperlipidemia   . Mild aortic stenosis   . Paroxysmal atrial fibrillation     rate controlled on diltiazem and anticoagulated on Xarelto to  . Allergy     Past Surgical History  Procedure Laterality Date  . Cataract extraction, bilateral    . Tonsillectomy    . Eye surgery    . Appendectomy  02/07/2009    Dr  Dwain Sarna  . Ercp N/A 04/20/2013    Procedure: ENDOSCOPIC RETROGRADE CHOLANGIOPANCREATOGRAPHY (ERCP);  Surgeon: Theda Belfast, MD;  Location: [facility];  Service: Endoscopy;  Laterality: N/A;  . Cholecystectomy N/A 04/21/2013    Procedure: LAPAROSCOPIC CHOLECYSTECTOMY WITH INTRAOPERATIVE CHOLANGIOGRAM;  Surgeon: Ardeth Sportsman, MD;  Location: [facility];  Service: General;  Laterality: N/A;    History   Social History  . Marital Status: Married    Spouse Name: N/A    Number of Children: N/A  . Years of Education: N/A   Occupational History  . Not on file.   Social History Main Topics  . Smoking status: Former Smoker    Quit date: 04/04/1969  . Smokeless tobacco: Former Neurosurgeon    Quit date: 04/04/1969  . Alcohol Use: Yes     Comment: occasional - wine  . Drug Use: No  . Sexual Activity: No   Other Topics Concern  . Not on file   Social History Narrative  . No narrative on file    Family History  Problem Relation Age of Onset  . Lung disease Father     Current Outpatient Prescriptions  Medication Sig Dispense Refill  . aspirin 81 MG EC tablet Take 81 mg by mouth daily. Swallow whole.      Marland Kitchen atorvastatin (LIPITOR) 20 MG tablet Take 20 mg by mouth daily.      . bisacodyl (DULCOLAX) 10 MG suppository Place 1 suppository (10 mg total) rectally every 12 (twelve) hours as needed.  12 suppository  0  . cholecalciferol (VITAMIN D)  1000 UNITS tablet Take 1,000 Units by mouth daily.      Marland Kitchen diltiazem (CARDIZEM CD) 120 MG 24 hr capsule Take 1 capsule (120 mg total) by mouth daily.  90 capsule  3  . doxazosin (CARDURA) 4 MG tablet Take 0.5 tablets (2 mg total) by mouth at bedtime.      . fish oil-omega-3 fatty acids 1000 MG capsule Take 1 g by mouth daily.      . flunisolide (NASALIDE) 25 MCG/ACT (0.025%) SOLN Inhale 2 sprays into the lungs 2 (two) times daily.      . furosemide (LASIX) 20 MG tablet Take 20 mg by mouth daily.       . Ipratropium-Albuterol (COMBIVENT RESPIMAT) 20-100  MCG/ACT AERS respimat Inhale 1 puff into the lungs every 6 (six) hours.      . metoprolol succinate (TOPROL-XL) 25 MG 24 hr tablet Take 25 mg by mouth every morning.      . Multiple Vitamin (MULTIVITAMIN) tablet Take 1 tablet by mouth daily.      Marland Kitchen omeprazole (PRILOSEC) 20 MG capsule Take 20 mg by mouth daily.      . Rivaroxaban (XARELTO) 15 MG TABS tablet Take 15 mg by mouth every morning.      . traMADol (ULTRAM) 50 MG tablet Take 1-2 tablets (50-100 mg total) by mouth every 6 (six) hours as needed for pain.  30 tablet  1   No current facility-administered medications for this visit.     No Known Allergies  BP 104/66  Pulse 68  Temp(Src) 98.2 F (36.8 C) (Temporal)  Resp 14  Ht 5' 7.5" (1.715 m)  Wt 138 lb 6.4 oz (62.778 kg)  BMI 21.34 kg/m2  Dg Cholangiogram Operative  04/21/2013   CLINICAL DATA:  Gallstones  EXAM: INTRAOPERATIVE CHOLANGIOGRAM  TECHNIQUE: Cholangiographic images from the C-arm fluoroscopic device were submitted for interpretation post-operatively. Please see the procedural report for the amount of contrast and the fluoroscopy time utilized.  COMPARISON:  None.  FINDINGS: Contrast fills the biliary tree to the level of the upper common bile duct. Contrast never passes into the distal common bile duct or duodenum. Contrast leakage is noted. Bile leak cannot be excluded.  IMPRESSION: The common bile duct is occluded. Bile leak cannot be excluded.   Electronically Signed   By: Maryclare Bean M.D.   On: 04/21/2013 10:03   US Abdomen Complete  04/18/2013   *RADIOLOGY REPORT*  Clinical Data:  The small bowel abdominal pain and elevated liver function tests.  COMPLETE ABDOMINAL ULTRASOUND  Comparison:  CT scan of the abdomen dated 02/07/2009  Findings:  Gallbladder:  The gallbladder is quite distended with the thickening of the gallbladderwall and pericholecystic fluid as well as sludge and stones in the gallbladder.  Findings are consistent with acute cholecystitis.  However, the  patient has a negative sonographic Murphy's sign.  Common bile duct:  Common bile duct is 7.4 mm in diameter.  There is slight intrahepatic ductal dilatation.  No visible common bile duct stones.  Liver:  Normal slightly dilated intrahepatic ducts.  No focal mass lesions.  IVC:  Normal.  Pancreas:  The pancreas is normal except for dilatation of the pancreatic duct to 3.8 mm.  Spleen:  Normal.  And 7.2 cm in length.  Right Kidney:  10.9 cm in length.  Multiple renal calculi.  Left Kidney:  Normal.  9.0 cm in length.  Abdominal aorta:  Maximum diameter is approximately 3 cm at a focal bulge in the  mid abdominal aorta.  IMPRESSION:  1.  Cholelithiasis with edematous gallbladder wall and pericholecystic fluid consistent with cholecystitis. 2.  Slightly dilated intra and extrahepatic bile ducts and dilated pancreatic duct consistent with distal common bile duct obstruction.  No discrete stone is identified on this exam however. 3.  Multiple right renal calculi. 4.  Focal dilatation of the abdominal aorta to a diameter of 3 cm.   Original Report Authenticated By: Francene Boyers, M.D.   Mr 3d Recon At Scanner  04/20/2013   CLINICAL DATA:  Right upper quadrant abdominal pain with elevated liver function tests.  EXAM: MRI ABDOMEN WITHOUT AND WITH CONTRAST (INCLUDING MRCP)  TECHNIQUE: Multiplanar multisequence MR imaging of the abdomen was performed both before and after the administration of intravenous contrast. Heavily T2-weighted images of the biliary and pancreatic ducts were obtained, and three-dimensional MRCP images were rendered by post processing.  CONTRAST:  7mL MULTIHANCE GADOBENATE DIMEGLUMINE 529 MG/ML IV SOLN  COMPARISON:  04/18/2013  FINDINGS: Despite efforts by the technologist and patient, motion artifact is present on today's exam and could not be eliminated. This reduces exam sensitivity and specificity.  Small bilateral pleural effusions with passive atelectasis. Mild right heart prominence.  T2  hyperintense 1.9 x 1.4 cm cyst in segment 8. Spleen and adrenal glands unremarkable.  Diffuse subcutaneous, mesenteric, and retroperitoneal edema. On image 22 of series 3 there is a 10 mm filling defect with low T2 and high precontrast T1 signal in the common hepatic duct or common bile duct. Possible dependent layering of smaller filling defects distal to this larger filling defect. I am uncertain if the filling defect enhances given the high precontrast T1 signal and motion artifact limitations of the exam. There also appear to be filling defects dependently in the gallbladder compatible with gallstones within the gallbladder. Gallbladder wall thickening is present with mild pericholecystic fluid.  The dorsal pancreatic duct borderline prominent. Bilateral renal upper pole benign cysts noted.  There is dextroconvex lumbar scoliosis with spondylosis and degenerative disc disease.  The abdominal aorta is abnormal. There is an infrarenal saccular aneurysm with the abdominal aorta measuring 3.4 cm transverse and the saccular aneurysm with potential focal dissection measuring 1.3 cm in width along the right side of the abdominal aorta. There is also a focal dissection further caudad in the abdominal aorta. High precontrast T1 signal along the right side of the abdominal aorta in the vicinity of the right renal artery origin could represent a small acute intramural hematoma.  IMPRESSION: 1. Highly limited exam due to motion artifact, with the CBD nearly completely obscured on the dedicated MRCP images due to patient inability to suspend respiration or control motion. However, axial images suggest a 10 mm filling defect in the junction of the common hepatic duct and common bile duct with high T1 and low T2 signal characteristics. Possible similar smaller filling defects dependently in the distal CBD. The high T1 signal characteristics are unusual for stone but could represent subacute clot in the duct. Tumor is a less  likely differential diagnostic consideration. Common bile duct caliber 10 mm, mildly dilated. 2. Diffuse subcutaneous, retroperitoneal, and mesenteric edema compatible with extensive 3rd spacing of fluid. Small bilateral pleural effusions. There is gallbladder wall thickening and mild pericholecystic fluid. Enlarged right heart. 3. Cholelithiasis 4. Abnormal abdominal aorta with suspected acute intramural hematoma in the vicinity of the right renal artery origin, focal dissection inferiorly, and a saccular infrarenal abdominal aortic aneurysm. 5. Hepatic and renal cysts. 6. Considerable dextroconvex lumbar  scoliosis with rotary component.   Electronically Signed   By: Herbie Baltimore   On: 04/20/2013 08:35   Dg Ercp Biliary & Pancreatic Ducts  04/20/2013   *RADIOLOGY REPORT*  Clinical Data: Bile duct stones.  Fluoroscopy time:  3 minutes 24 seconds.  ERCP  Comparison: MRCP dated 04/20/2013 and abdominal ultrasound dated 04/18/2013  Findings: Several images demonstrate filling defects in the common bile duct.  Slight dilatation of intra and extrahepatic bile ducts.  IMPRESSION: Common bile duct stones.   Original Report Authenticated By: Francene Boyers, M.D.   Mr Abd W/wo Cm/mrcp  04/20/2013   CLINICAL DATA:  Right upper quadrant abdominal pain with elevated liver function tests.  EXAM: MRI ABDOMEN WITHOUT AND WITH CONTRAST (INCLUDING MRCP)  TECHNIQUE: Multiplanar multisequence MR imaging of the abdomen was performed both before and after the administration of intravenous contrast. Heavily T2-weighted images of the biliary and pancreatic ducts were obtained, and three-dimensional MRCP images were rendered by post processing.  CONTRAST:  7mL MULTIHANCE GADOBENATE DIMEGLUMINE 529 MG/ML IV SOLN  COMPARISON:  04/18/2013  FINDINGS: Despite efforts by the technologist and patient, motion artifact is present on today's exam and could not be eliminated. This reduces exam sensitivity and specificity.  Small bilateral  pleural effusions with passive atelectasis. Mild right heart prominence.  T2 hyperintense 1.9 x 1.4 cm cyst in segment 8. Spleen and adrenal glands unremarkable.  Diffuse subcutaneous, mesenteric, and retroperitoneal edema. On image 22 of series 3 there is a 10 mm filling defect with low T2 and high precontrast T1 signal in the common hepatic duct or common bile duct. Possible dependent layering of smaller filling defects distal to this larger filling defect. I am uncertain if the filling defect enhances given the high precontrast T1 signal and motion artifact limitations of the exam. There also appear to be filling defects dependently in the gallbladder compatible with gallstones within the gallbladder. Gallbladder wall thickening is present with mild pericholecystic fluid.  The dorsal pancreatic duct borderline prominent. Bilateral renal upper pole benign cysts noted.  There is dextroconvex lumbar scoliosis with spondylosis and degenerative disc disease.  The abdominal aorta is abnormal. There is an infrarenal saccular aneurysm with the abdominal aorta measuring 3.4 cm transverse and the saccular aneurysm with potential focal dissection measuring 1.3 cm in width along the right side of the abdominal aorta. There is also a focal dissection further caudad in the abdominal aorta. High precontrast T1 signal along the right side of the abdominal aorta in the vicinity of the right renal artery origin could represent a small acute intramural hematoma.  IMPRESSION: 1. Highly limited exam due to motion artifact, with the CBD nearly completely obscured on the dedicated MRCP images due to patient inability to suspend respiration or control motion. However, axial images suggest a 10 mm filling defect in the junction of the common hepatic duct and common bile duct with high T1 and low T2 signal characteristics. Possible similar smaller filling defects dependently in the distal CBD. The high T1 signal characteristics are  unusual for stone but could represent subacute clot in the duct. Tumor is a less likely differential diagnostic consideration. Common bile duct caliber 10 mm, mildly dilated. 2. Diffuse subcutaneous, retroperitoneal, and mesenteric edema compatible with extensive 3rd spacing of fluid. Small bilateral pleural effusions. There is gallbladder wall thickening and mild pericholecystic fluid. Enlarged right heart. 3. Cholelithiasis 4. Abnormal abdominal aorta with suspected acute intramural hematoma in the vicinity of the right renal artery origin, focal dissection inferiorly,  and a saccular infrarenal abdominal aortic aneurysm. 5. Hepatic and renal cysts. 6. Considerable dextroconvex lumbar scoliosis with rotary component.   Electronically Signed   By: Herbie Baltimore   On: 04/20/2013 08:35     Review of Systems  Constitutional: Negative for fever, chills and diaphoresis.  HENT: Negative for sore throat and trouble swallowing.   Eyes: Negative for photophobia and visual disturbance.  Respiratory: Negative for choking and shortness of breath.   Cardiovascular: Negative for chest pain and palpitations.  Gastrointestinal: Negative for nausea, vomiting, abdominal distention, anal bleeding and rectal pain.  Genitourinary: Negative for dysuria, urgency, difficulty urinating and testicular pain.  Musculoskeletal: Negative for arthralgias, gait problem, myalgias and neck pain.  Skin: Negative for color change and rash.  Neurological: Negative for dizziness, speech difficulty, weakness and numbness.  Hematological: Negative for adenopathy.  Psychiatric/Behavioral: Negative for hallucinations, confusion and agitation.       Objective:   Physical Exam  Constitutional: He is oriented to person, place, and time. He appears well-developed and well-nourished. No distress.  HENT:  Head: Normocephalic.  Mouth/Throat: Oropharynx is clear and moist. No oropharyngeal exudate.  Eyes: Conjunctivae and EOM are  normal. Pupils are equal, round, and reactive to light. No scleral icterus.  Neck: Normal range of motion. Neck supple. No tracheal deviation present.  Cardiovascular: Normal rate, regular rhythm, normal heart sounds and intact distal pulses.   Pulmonary/Chest: Effort normal and breath sounds normal. No respiratory distress.  Abdominal: Soft. He exhibits no distension. There is no tenderness. Hernia confirmed negative in the right inguinal area and confirmed negative in the left inguinal area.  Incisions clean with normal healing ridges.  No hernias  Musculoskeletal: Normal range of motion. He exhibits no tenderness.  Lymphadenopathy:    He has no cervical adenopathy.       Right: No inguinal adenopathy present.       Left: No inguinal adenopathy present.  Neurological: He is alert and oriented to person, place, and time. No cranial nerve deficit. He exhibits normal muscle tone. Coordination normal.  Skin: Skin is warm and dry. No rash noted. He is not diaphoretic. No erythema. No pallor.  Psychiatric: He has a normal mood and affect. His behavior is normal. Judgment and thought content normal.       Assessment:     Recovering remarkably well status post urgent cholecystectomy.     Plan:     Increase activity as tolerated to regular activity.  Low impact exercise such as walking an hour a day at least ideal.  Do not push through pain.  Okay to return to driving as that was his baseline.  Diet as tolerated.  Low fat high fiber diet ideal.  Bowel regimen with 30 g fiber a day and fiber supplement as needed to avoid problems.  I am happily pleased to see how well he has recovered.  His wife feels reassured as well.  Return to clinic as needed.   Instructions discussed.  Followup with primary care physician for other health issues as would normally be done.  Questions answered.  The patient expressed understanding and appreciation

## 2013-07-06 ENCOUNTER — Encounter: Payer: Self-pay | Admitting: Cardiovascular Disease

## 2013-07-06 ENCOUNTER — Ambulatory Visit (INDEPENDENT_AMBULATORY_CARE_PROVIDER_SITE_OTHER): Payer: Medicare Other | Admitting: Cardiovascular Disease

## 2013-07-06 VITALS — BP 102/50 | HR 60 | Ht 67.0 in | Wt 145.6 lb

## 2013-07-06 DIAGNOSIS — I10 Essential (primary) hypertension: Secondary | ICD-10-CM

## 2013-07-06 DIAGNOSIS — E785 Hyperlipidemia, unspecified: Secondary | ICD-10-CM

## 2013-07-06 DIAGNOSIS — I480 Paroxysmal atrial fibrillation: Secondary | ICD-10-CM

## 2013-07-06 DIAGNOSIS — I4891 Unspecified atrial fibrillation: Secondary | ICD-10-CM

## 2013-07-06 MED ORDER — RIVAROXABAN 15 MG PO TABS: 15.0000 mg | ORAL_TABLET | Freq: Every morning | ORAL | Status: DC

## 2013-07-06 NOTE — Assessment & Plan Note (Signed)
On statin therapy followed by his PCP 

## 2013-07-06 NOTE — Assessment & Plan Note (Signed)
Maintaining sinus rhythm on Xarelto  oral anticoagulation as well as diltiazem.

## 2013-07-06 NOTE — Assessment & Plan Note (Signed)
Well-controlled on current medications 

## 2013-07-06 NOTE — Progress Notes (Signed)
07/06/2013 Jeff Barrett   01-10-1921  [id number]  Primary Physician Thayer Headings, MD Primary Cardiologist: Runell Gess MD [name]   HPI:  [name] is a pleasant 79 y/o who is here today as a post hospital visit after he had a lap chole 04/21/13. He is here with his wife of 71 yrs. 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 Last year his son took the family to tour [place] and the battlefields. He has a past medical history significant for HTN, dyslipidemia, and PAF which was noted this past spring. He had been holding NSR on Diltiazem. His LOV was June 2014. He is on Xarelto. He has no history of CAD or MI. A Myoview done in June 2011 was low risk. Echo in June 2014 showed an EF of 55-65% with grade 2 diastolic dysfunction. 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 We saw him peri-op. He did have some bradycardia and hypotension pre-op and his Diltiazem and Cardura were cut back. His Xarelto was held for 48 hrs pre op. Post op he did well, I believe he had some tachycardia early post op. For some reason at discharge he was sent home on his original dose of Diltiazem 240 mg and Diltiazem 60 mg BID. Fortunately he has not had any syncope. He saw [facility] back 05/01/13 postoperatively. He has been doing well since and denies chest pain shortness of breath or dizziness.  Current Outpatient Prescriptions  Medication Sig Dispense Refill  . aspirin 81 MG EC tablet Take 81 mg by mouth daily. Swallow whole.      Marland Kitchen atorvastatin (LIPITOR) 20 MG tablet Take 20 mg by mouth daily.      . bisacodyl (DULCOLAX) 10 MG suppository Place 1 suppository (10 mg total) rectally every 12 (twelve) hours as needed.  12 suppository  0  . cholecalciferol (VITAMIN D) 1000 UNITS tablet Take 1,000 Units by mouth daily.      Marland Kitchen diltiazem (CARDIZEM CD) 120 MG 24 hr capsule Take 1 capsule (120 mg total) by mouth daily.  90 capsule  3  .  doxazosin (CARDURA) 4 MG tablet Take 0.5 tablets (2 mg total) by mouth at bedtime.      . fish oil-omega-3 fatty acids 1000 MG capsule Take 1 g by mouth daily.      . flunisolide (NASALIDE) 25 MCG/ACT (0.025%) SOLN Inhale 2 sprays into the lungs 2 (two) times daily.      . furosemide (LASIX) 20 MG tablet Take 20 mg by mouth daily.       . Ipratropium-Albuterol (COMBIVENT RESPIMAT) 20-100 MCG/ACT AERS respimat Inhale 1 puff into the lungs every 6 (six) hours.      . metoprolol succinate (TOPROL-XL) 25 MG 24 hr tablet Take 25 mg by mouth every morning.      . Multiple Vitamin (MULTIVITAMIN) tablet Take 1 tablet by mouth daily.      Marland Kitchen omeprazole (PRILOSEC) 20 MG capsule Take 20 mg by mouth daily.      . Rivaroxaban (XARELTO) 15 MG TABS tablet Take 15 mg by mouth every morning.      . traMADol (ULTRAM) 50 MG tablet Take 1-2 tablets (50-100 mg total) by mouth every 6 (six) hours as needed for pain.  30 tablet  1   No current facility-administered medications for this visit.    No Known Allergies  History   Social History  .  Marital Status: Married    Spouse Name: N/A    Number of Children: N/A  . Years of Education: N/A   Occupational History  . Not on file.   Social History Main Topics  . Smoking status: Former Smoker    Quit date: 04/04/1969  . Smokeless tobacco: Former Neurosurgeon    Quit date: 04/04/1969  . Alcohol Use: Yes     Comment: occasional - wine  . Drug Use: No  . Sexual Activity: No   Other Topics Concern  . Not on file   Social History Narrative  . No narrative on file     Review of Systems: General: negative for chills, fever, night sweats or weight changes.  Cardiovascular: negative for chest pain, dyspnea on exertion, edema, orthopnea, palpitations, paroxysmal nocturnal dyspnea or shortness of breath Dermatological: negative for rash Respiratory: negative for cough or wheezing Urologic: negative for hematuria Abdominal: negative for nausea, vomiting, diarrhea,  bright red blood per rectum, melena, or hematemesis Neurologic: negative for visual changes, syncope, or dizziness All other systems reviewed and are otherwise negative except as noted above.    Blood pressure 102/50, pulse 60, height 5\' 7"  (1.702 m), weight 145 lb 9.6 oz (66.044 kg).  General appearance: alert and no distress Neck: no adenopathy, no carotid bruit, no JVD, supple, symmetrical, trachea midline and thyroid not enlarged, symmetric, no tenderness/mass/nodules Lungs: clear to auscultation bilaterally Heart: regular rate and rhythm, S1, S2 normal, no murmur, click, rub or gallop Extremities: extremities normal, atraumatic, no cyanosis or edema  EKG normal sinus rhythm at 60 with ST or T wave changes  ASSESSMENT AND PLAN:   Paroxysmal atrial fibrillation Maintaining sinus rhythm on Xarelto  oral anticoagulation as well as diltiazem.  Essential hypertension Well-controlled on current medications  Hyperlipidemia On statin therapy followed by his PCP      Runell Gess MD [facility], [facility] 07/06/2013 8:38 AM

## 2013-07-06 NOTE — Patient Instructions (Signed)
Your physician wants you to follow-up in [place] in 6 months and Dr Allyson Sabal 12 MONTHS.  You will receive a reminder letter in the mail two months in advance. If you don't receive a letter, please call our office to schedule the follow-up appointment.  CONTINUE CURRENT MEDICATION.

## 2013-07-09 ENCOUNTER — Encounter: Payer: Self-pay | Admitting: Cardiovascular Disease

## 2013-09-12 ENCOUNTER — Telehealth: Payer: Self-pay | Admitting: *Deleted

## 2013-09-12 MED ORDER — RIVAROXABAN 15 MG PO TABS: 15.0000 mg | ORAL_TABLET | Freq: Every morning | ORAL | Status: DC

## 2013-09-12 NOTE — Telephone Encounter (Signed)
Pt stated that he is out of Xarelto and he needs to be sent to [facility] on [address]

## 2013-09-12 NOTE — Telephone Encounter (Signed)
Rx was sent to pharmacy electronically. 

## 2013-09-13 ENCOUNTER — Other Ambulatory Visit: Payer: Self-pay | Admitting: Cardiovascular Disease

## 2013-10-01 ENCOUNTER — Encounter: Payer: Self-pay | Admitting: Cardiovascular Disease

## 2013-10-01 ENCOUNTER — Ambulatory Visit: Payer: Medicare Other | Admitting: Physician Assistant

## 2013-10-01 ENCOUNTER — Ambulatory Visit (INDEPENDENT_AMBULATORY_CARE_PROVIDER_SITE_OTHER): Payer: Medicare Other | Admitting: Cardiovascular Disease

## 2013-10-01 VITALS — BP 120/62 | HR 109 | Ht 67.5 in | Wt 152.6 lb

## 2013-10-01 DIAGNOSIS — I10 Essential (primary) hypertension: Secondary | ICD-10-CM

## 2013-10-01 DIAGNOSIS — I4891 Unspecified atrial fibrillation: Secondary | ICD-10-CM

## 2013-10-01 DIAGNOSIS — I480 Paroxysmal atrial fibrillation: Secondary | ICD-10-CM

## 2013-10-01 MED ORDER — METOPROLOL SUCCINATE ER 50 MG PO TB24: 50.0000 mg | ORAL_TABLET | Freq: Every morning | ORAL | Status: DC

## 2013-10-01 NOTE — Assessment & Plan Note (Signed)
The patient remains in sinus rhythm on beta blocker, calcium channel blocker and oral anticoagulation (Xarelto ).today he is in sinus tachycardia. A good increase his beta blocker from Toprol-XL 25 mg daily to 50 mg daily. He'll come back to the office in one month check Kristen.

## 2013-10-01 NOTE — Assessment & Plan Note (Signed)
Well-controlled on current medications 

## 2013-10-01 NOTE — Progress Notes (Signed)
10/01/2013 Jeff Barrett   Oct 28, 1920  [id number]  Primary Physician Thressa Sheller, MD Primary Cardiologist: Lorretta Harp MD Renae Gloss   HPI:  Jeff Barrett is a pleasant 80 y/o who is here today as a post hospital visit after he had a lap chole 04/21/13. He is here with his wife of 10 yrs. 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 Last year his son took the family to tour [place] and the battlefields. He has a past medical history significant for HTN, dyslipidemia, and PAF which was noted this past spring. He had been holding NSR on Diltiazem. His LOV was June 2014. He is on Xarelto. He has no history of CAD or MI. A Myoview done in June 2011 was low risk. Echo in June 2014 showed an EF of 55-65% with grade 2 diastolic dysfunction. 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 We saw him peri-op. He did have some bradycardia and hypotension pre-op and his Diltiazem and Cardura were cut back. His Xarelto was held for 48 hrs pre op. Post op he did well, I believe he had some tachycardia early post op. For some reason at discharge he was sent home on his original dose of Diltiazem 240 mg and Diltiazem 60 mg BID. Fortunately he has not had any syncope. He saw Kerin R[facility] back 05/01/13 postoperatively. He has been doing well since and denies chest pain shortness of breath or dizziness. He is scheduled for a urologic logic procedure for cholelithiasis in the near future that is being done because of an episode of hematuria. I'm putting him at low risk and he can stop his anticoagulation 2 days prior. He did tolerate his laparoscopic cholecystectomy without event.    Current Outpatient Prescriptions  Medication Sig Dispense Refill  . aspirin 81 MG EC tablet Take 81 mg by mouth daily. Swallow whole.      Marland Kitchen atorvastatin (LIPITOR) 20 MG tablet Take 20 mg by mouth daily.      . bisacodyl (DULCOLAX) 10 MG suppository Place 1  suppository (10 mg total) rectally every 12 (twelve) hours as needed.  12 suppository  0  . cholecalciferol (VITAMIN D) 1000 UNITS tablet Take 1,000 Units by mouth daily.      Marland Kitchen diltiazem (CARDIZEM CD) 120 MG 24 hr capsule Take 1 capsule (120 mg total) by mouth daily.  90 capsule  3  . doxazosin (CARDURA) 4 MG tablet Take 0.5 tablets (2 mg total) by mouth at bedtime.      . fish oil-omega-3 fatty acids 1000 MG capsule Take 1 g by mouth daily.      . flunisolide (NASALIDE) 25 MCG/ACT (0.025%) SOLN Inhale 2 sprays into the lungs 2 (two) times daily.      . furosemide (LASIX) 20 MG tablet Take 20 mg by mouth daily.       . Ipratropium-Albuterol (COMBIVENT RESPIMAT) 20-100 MCG/ACT AERS respimat Inhale 1 puff into the lungs every 6 (six) hours.      . metoprolol succinate (TOPROL-XL) 25 MG 24 hr tablet Take 25 mg by mouth every morning.      . Multiple Vitamin (MULTIVITAMIN) tablet Take 1 tablet by mouth daily.      Marland Kitchen omeprazole (PRILOSEC) 20 MG capsule Take 20 mg by mouth daily.      . Rivaroxaban (XARELTO) 15 MG TABS tablet Take 1 tablet (15 mg total) by mouth every morning.  [place]  tablet  5  . traMADol (ULTRAM) 50 MG tablet Take 1-2 tablets (50-100 mg total) by mouth every 6 (six) hours as needed for pain.  30 tablet  1   No current facility-administered medications for this visit.    No Known Allergies  History   Social History  . Marital Status: Married    Spouse Name: N/A    Number of Children: N/A  . Years of Education: N/A   Occupational History  . Not on file.   Social History Main Topics  . Smoking status: Former Smoker    Quit date: 04/04/1969  . Smokeless tobacco: Former Systems developer    Quit date: 04/04/1969  . Alcohol Use: Yes     Comment: occasional - wine  . Drug Use: No  . Sexual Activity: No   Other Topics Concern  . Not on file   Social History Narrative  . No narrative on file     Review of Systems: General: negative for chills, fever, night sweats or weight  changes.  Cardiovascular: negative for chest pain, dyspnea on exertion, edema, orthopnea, palpitations, paroxysmal nocturnal dyspnea or shortness of breath Dermatological: negative for rash Respiratory: negative for cough or wheezing Urologic: negative for hematuria Abdominal: negative for nausea, vomiting, diarrhea, bright red blood per rectum, melena, or hematemesis Neurologic: negative for visual changes, syncope, or dizziness All other systems reviewed and are otherwise negative except as noted above.    Blood pressure 120/62, pulse 109, height 5' 7.5" (1.715 m), weight 69.219 kg (152 lb 9.6 oz).  General appearance: alert and no distress Neck: no adenopathy, no carotid bruit, no JVD, supple, symmetrical, trachea midline and thyroid not enlarged, symmetric, no tenderness/mass/nodules Lungs: clear to auscultation bilaterally Heart: regular rate and rhythm, S1, S2 normal, no murmur, click, rub or gallop and tachycardic Extremities: extremities normal, atraumatic, no cyanosis or edema  EKG sinus tachycardia at 109 without ST or T wave changes  ASSESSMENT AND PLAN:   Paroxysmal atrial fibrillation The patient remains in sinus rhythm on beta blocker, calcium channel blocker and oral anticoagulation (Xarelto ).today he is in sinus tachycardia. A good increase his beta blocker from Toprol-XL 25 mg daily to 50 mg daily. He'll come back to the office in one month check Kristen.  Essential hypertension Well-controlled on current medications      Lorretta Harp MD [facility] 10/01/2013 3:08 PM

## 2013-10-01 NOTE — Patient Instructions (Signed)
Your physician wants you to follow-up in: 6 months with an extender with 1 year with Dr Gwenlyn Found.  You will receive a reminder letter in the mail two months in advance. If you don't receive a letter, please call our office to schedule the follow-up appointment.   Increase Toprol to 50mg  daily.  Come back in one month for a blood pressure check with our pharmacist.

## 2013-10-02 ENCOUNTER — Other Ambulatory Visit (HOSPITAL_COMMUNITY): Payer: Self-pay | Admitting: *Deleted

## 2013-10-02 ENCOUNTER — Encounter (HOSPITAL_COMMUNITY): Payer: Self-pay | Admitting: Pharmacy Technician

## 2013-10-02 ENCOUNTER — Other Ambulatory Visit: Payer: Self-pay | Admitting: Urology

## 2013-10-02 NOTE — Patient Instructions (Addendum)
Jeff Barrett  10/02/2013                           YOUR PROCEDURE IS SCHEDULED ON: 10/05/13               PLEASE REPORT TO SHORT STAY CENTER AT : 6:30 AM               CALL THIS NUMBER IF ANY PROBLEMS THE DAY OF SURGERY :               [phone]                                REMEMBER:   Do not eat food or drink liquids AFTER MIDNIGHT                 Take these medicines the morning of surgery with A SIP OF WATER:  LIPITOR / DILTIAZEM / OMEPRAZOLE / MAY TAKE TRAMADOL IF NEEDED / USE COMBIVENT INHALER   Do not wear jewelry, make-up   Do not wear lotions, powders, or perfumes.   Do not shave legs or underarms 12 hrs. before surgery (men may shave face)  Do not bring valuables to the hospital.  Contacts, dentures or bridgework may not be worn into surgery.  Leave suitcase in the car. After surgery it may be brought to your room.  For patients admitted to the hospital more than one night, checkout time is            11:00 AM                                                       The day of discharge.   Patients discharged the day of surgery will not be allowed to drive home.            If going home same day of surgery, must have someone stay with you              FIRST 24 hrs at home and arrange for some one to drive you              home from hospital.    Special Instructions             Please read over the following fact sheets that you were given:               1. [place]                                                X_____________________________________________________________________        Failure to follow these instructions may result in cancellation of your surgery

## 2013-10-02 NOTE — Progress Notes (Signed)
Need orders in EPIC.  Surgery scheduled for 10/05/13.  Preop on 10/03/13 at 0830am.

## 2013-10-03 ENCOUNTER — Encounter (HOSPITAL_COMMUNITY): Payer: Self-pay

## 2013-10-03 ENCOUNTER — Telehealth: Payer: Self-pay | Admitting: Cardiovascular Disease

## 2013-10-03 ENCOUNTER — Encounter (HOSPITAL_COMMUNITY)
Admission: RE | Admit: 2013-10-03 | Discharge: 2013-10-03 | Disposition: A | Discharge status: Home / Self Care | Payer: Medicare Other | Source: Ambulatory Visit | Attending: Urology | Admitting: Urology

## 2013-10-03 ENCOUNTER — Ambulatory Visit (HOSPITAL_COMMUNITY)
Admission: RE | Admit: 2013-10-03 | Discharge: 2013-10-03 | Disposition: A | Discharge status: Home / Self Care | Payer: Medicare Other | Source: Ambulatory Visit | Attending: Urology | Admitting: Urology

## 2013-10-03 DIAGNOSIS — Z01812 Encounter for preprocedural laboratory examination: Secondary | ICD-10-CM | POA: Insufficient documentation

## 2013-10-03 DIAGNOSIS — Z01818 Encounter for other preprocedural examination: Secondary | ICD-10-CM | POA: Insufficient documentation

## 2013-10-03 HISTORY — DX: Personal history of urinary calculi: Z87.442

## 2013-10-03 HISTORY — DX: Hematuria, unspecified: R31.9

## 2013-10-03 LAB — CBC
HEMATOCRIT: 31.5 % — AB (ref 39.0–52.0)
Hemoglobin: 10.3 g/dL — ABNORMAL LOW (ref 13.0–17.0)
MCH: 30.8 pg (ref 26.0–34.0)
MCHC: 32.7 g/dL (ref 30.0–36.0)
MCV: 94.3 fL (ref 78.0–100.0)
PLATELETS: 237 10*3/uL (ref 150–400)
RBC: 3.34 MIL/uL — ABNORMAL LOW (ref 4.22–5.81)
RDW: 14.3 % (ref 11.5–15.5)
WBC: 7.6 10*3/uL (ref 4.0–10.5)

## 2013-10-03 LAB — BASIC METABOLIC PANEL
BUN: 19 mg/dL (ref 6–23)
CO2: 28 mEq/L (ref 19–32)
Calcium: 8.9 mg/dL (ref 8.4–10.5)
Chloride: 101 mEq/L (ref 96–112)
Creatinine, Ser: 1.58 mg/dL — ABNORMAL HIGH (ref 0.50–1.35)
GFR calc Af Amer: 43 mL/min — ABNORMAL LOW (ref >90)
GFR calc non Af Amer: 37 mL/min — ABNORMAL LOW (ref >90)
Glucose, Bld: 100 mg/dL — ABNORMAL HIGH (ref 70–99)
Potassium: 4.5 mEq/L (ref 3.7–5.3)
Sodium: 139 mEq/L (ref 137–147)

## 2013-10-03 MED ORDER — METOPROLOL SUCCINATE ER 25 MG PO TB24: 25.0000 mg | ORAL_TABLET | Freq: Every day | ORAL | Status: AC

## 2013-10-03 NOTE — Telephone Encounter (Signed)
REVIEWED WITH DR Gwenlyn Found.  INFORMED PATIENT TO RETURN TO 25 MG PATIENT IS AWARE

## 2013-10-03 NOTE — Progress Notes (Signed)
I spoke with Dr. Landry Dyke , anesthesiologist, concerning pt's low BP and HR and the recent increase in metoprolol dose from 25 mg to 50 mg. Pt says he has taken 2 doses of the 50 mg.since 10/02/13  per Dr. Gwenlyn Found. Dr. Landry Dyke instructed that pt should return to the 25 mg dose until further instructions by Dr. Gwenlyn Found. Pt may take the 25 mg dose the morning of surgery. Pt instructed to call Dr. Gwenlyn Found and inform him of BP and HR. and to lower dose to 25 mg.

## 2013-10-03 NOTE — Telephone Encounter (Signed)
Pt saw Dr Gwenlyn Found yesterday and he changed his Metoprolol 25 mg to 50 mg. When he went to the hospital for his pre-admission his blood pressure was low. Left arm it was 88/47 and right arm was 70/37,the nurse was concerned and so is the patient.Please call asap.

## 2013-10-05 ENCOUNTER — Encounter (HOSPITAL_COMMUNITY): Payer: Medicare Other | Admitting: Certified Registered Nurse Anesthetist

## 2013-10-05 ENCOUNTER — Ambulatory Visit (HOSPITAL_COMMUNITY): Payer: Medicare Other | Admitting: Certified Registered Nurse Anesthetist

## 2013-10-05 ENCOUNTER — Ambulatory Visit (HOSPITAL_COMMUNITY)
Admission: RE | Admit: 2013-10-05 | Discharge: 2013-10-05 | Disposition: A | Discharge status: Home / Self Care | Payer: Medicare Other | Source: Ambulatory Visit | Attending: Urology | Admitting: Urology

## 2013-10-05 ENCOUNTER — Ambulatory Visit (HOSPITAL_COMMUNITY): Payer: Medicare Other

## 2013-10-05 ENCOUNTER — Encounter (HOSPITAL_COMMUNITY): Payer: Self-pay | Admitting: Certified Registered Nurse Anesthetist

## 2013-10-05 ENCOUNTER — Encounter (HOSPITAL_COMMUNITY)
Admission: RE | Disposition: A | Discharge status: Home / Self Care | Payer: Self-pay | Source: Ambulatory Visit | Attending: Urology

## 2013-10-05 DIAGNOSIS — Z79899 Other long term (current) drug therapy: Secondary | ICD-10-CM | POA: Insufficient documentation

## 2013-10-05 DIAGNOSIS — D649 Anemia, unspecified: Secondary | ICD-10-CM | POA: Insufficient documentation

## 2013-10-05 DIAGNOSIS — E7800 Pure hypercholesterolemia, unspecified: Secondary | ICD-10-CM | POA: Insufficient documentation

## 2013-10-05 DIAGNOSIS — C676 Malignant neoplasm of ureteric orifice: Secondary | ICD-10-CM | POA: Insufficient documentation

## 2013-10-05 DIAGNOSIS — I10 Essential (primary) hypertension: Secondary | ICD-10-CM | POA: Insufficient documentation

## 2013-10-05 DIAGNOSIS — Z9089 Acquired absence of other organs: Secondary | ICD-10-CM | POA: Insufficient documentation

## 2013-10-05 DIAGNOSIS — R310 Gross hematuria: Secondary | ICD-10-CM | POA: Insufficient documentation

## 2013-10-05 DIAGNOSIS — Z87891 Personal history of nicotine dependence: Secondary | ICD-10-CM | POA: Insufficient documentation

## 2013-10-05 DIAGNOSIS — K219 Gastro-esophageal reflux disease without esophagitis: Secondary | ICD-10-CM | POA: Insufficient documentation

## 2013-10-05 DIAGNOSIS — Z7901 Long term (current) use of anticoagulants: Secondary | ICD-10-CM | POA: Insufficient documentation

## 2013-10-05 DIAGNOSIS — N400 Enlarged prostate without lower urinary tract symptoms: Secondary | ICD-10-CM | POA: Insufficient documentation

## 2013-10-05 DIAGNOSIS — I4891 Unspecified atrial fibrillation: Secondary | ICD-10-CM | POA: Insufficient documentation

## 2013-10-05 DIAGNOSIS — N201 Calculus of ureter: Secondary | ICD-10-CM | POA: Insufficient documentation

## 2013-10-05 DIAGNOSIS — N200 Calculus of kidney: Secondary | ICD-10-CM

## 2013-10-05 HISTORY — PX: TRANSURETHRAL RESECTION OF BLADDER TUMOR: SHX2575

## 2013-10-05 HISTORY — PX: HOLMIUM LASER APPLICATION: SHX5852

## 2013-10-05 HISTORY — PX: CYSTOSCOPY WITH URETEROSCOPY AND STENT PLACEMENT: SHX6377

## 2013-10-05 SURGERY — CYSTOURETEROSCOPY, WITH STENT INSERTION
Anesthesia: General | Laterality: Right

## 2013-10-05 MED ORDER — PROPOFOL 10 MG/ML IV BOLUS
INTRAVENOUS | Status: AC
  Filled 2013-10-05: qty 20

## 2013-10-05 MED ORDER — CIPROFLOXACIN IN D5W 400 MG/200ML IV SOLN
INTRAVENOUS | Status: AC
  Filled 2013-10-05: qty 200

## 2013-10-05 MED ORDER — PROPOFOL 10 MG/ML IV BOLUS
INTRAVENOUS | Status: DC | PRN
  Administered 2013-10-05: 130 mg via INTRAVENOUS
  Administered 2013-10-05: 40 mg via INTRAVENOUS

## 2013-10-05 MED ORDER — LACTATED RINGERS IV SOLN
INTRAVENOUS | Status: DC | PRN
  Administered 2013-10-05 (×2): via INTRAVENOUS

## 2013-10-05 MED ORDER — IOHEXOL 300 MG/ML  SOLN
INTRAMUSCULAR | Status: DC | PRN
  Administered 2013-10-05: 17 mL

## 2013-10-05 MED ORDER — LIDOCAINE HCL (CARDIAC) 20 MG/ML IV SOLN
INTRAVENOUS | Status: DC | PRN
  Administered 2013-10-05: 70 mg via INTRAVENOUS

## 2013-10-05 MED ORDER — MORPHINE SULFATE 10 MG/ML IJ SOLN: 1.0000 mg | INTRAMUSCULAR | Status: DC | PRN

## 2013-10-05 MED ORDER — STERILE WATER FOR IRRIGATION IR SOLN
Status: DC | PRN
  Administered 2013-10-05: 3000 mL

## 2013-10-05 MED ORDER — SODIUM CHLORIDE 0.9 % IR SOLN
Status: DC | PRN
  Administered 2013-10-05: 8000 mL

## 2013-10-05 MED ORDER — ACETAMINOPHEN-CODEINE #3 300-30 MG PO TABS: 1.0000 | ORAL_TABLET | ORAL | Status: AC | PRN

## 2013-10-05 MED ORDER — EPHEDRINE SULFATE 50 MG/ML IJ SOLN
INTRAMUSCULAR | Status: DC | PRN
  Administered 2013-10-05: 5 mg via INTRAVENOUS
  Administered 2013-10-05: 10 mg via INTRAVENOUS
  Administered 2013-10-05 (×2): 5 mg via INTRAVENOUS

## 2013-10-05 MED ORDER — PROMETHAZINE HCL 25 MG/ML IJ SOLN: 6.2500 mg | INTRAMUSCULAR | Status: DC | PRN

## 2013-10-05 MED ORDER — FENTANYL CITRATE 0.05 MG/ML IJ SOLN
INTRAMUSCULAR | Status: AC
  Filled 2013-10-05: qty 5

## 2013-10-05 MED ORDER — PHENYLEPHRINE 40 MCG/ML (10ML) SYRINGE FOR IV PUSH (FOR BLOOD PRESSURE SUPPORT)
PREFILLED_SYRINGE | INTRAVENOUS | Status: AC
  Filled 2013-10-05: qty 10

## 2013-10-05 MED ORDER — ONDANSETRON HCL 4 MG/2ML IJ SOLN
INTRAMUSCULAR | Status: AC
  Filled 2013-10-05: qty 2

## 2013-10-05 MED ORDER — LIDOCAINE HCL 2 % EX GEL
CUTANEOUS | Status: AC
  Filled 2013-10-05: qty 10

## 2013-10-05 MED ORDER — PHENYLEPHRINE HCL 10 MG/ML IJ SOLN
INTRAMUSCULAR | Status: AC
  Filled 2013-10-05: qty 1

## 2013-10-05 MED ORDER — FENTANYL CITRATE 0.05 MG/ML IJ SOLN
INTRAMUSCULAR | Status: DC | PRN
  Administered 2013-10-05 (×3): 25 ug via INTRAVENOUS

## 2013-10-05 MED ORDER — CIPROFLOXACIN IN D5W 400 MG/200ML IV SOLN
400.0000 mg | INTRAVENOUS | Status: AC
  Administered 2013-10-05: 400 mg via INTRAVENOUS

## 2013-10-05 MED ORDER — LIDOCAINE HCL (CARDIAC) 20 MG/ML IV SOLN
INTRAVENOUS | Status: AC
Start: 2013-10-05 — End: 2013-10-05
  Filled 2013-10-05: qty 5

## 2013-10-05 MED ORDER — 0.9 % SODIUM CHLORIDE (POUR BTL) OPTIME
TOPICAL | Status: DC | PRN
  Administered 2013-10-05: 1000 mL

## 2013-10-05 MED ORDER — MIDAZOLAM HCL 2 MG/2ML IJ SOLN
INTRAMUSCULAR | Status: AC
  Filled 2013-10-05: qty 2

## 2013-10-05 MED ORDER — PHENYLEPHRINE HCL 10 MG/ML IJ SOLN
INTRAMUSCULAR | Status: DC | PRN
  Administered 2013-10-05 (×7): 80 ug via INTRAVENOUS

## 2013-10-05 MED ORDER — LIDOCAINE HCL 2 % EX GEL
CUTANEOUS | Status: DC | PRN
  Administered 2013-10-05: 1 via URETHRAL

## 2013-10-05 MED ORDER — PHENYLEPHRINE HCL 10 MG/ML IJ SOLN
10.0000 mg | INTRAVENOUS | Status: DC | PRN
  Administered 2013-10-05: 40 ug/min via INTRAVENOUS

## 2013-10-05 MED ORDER — LACTATED RINGERS IV SOLN: INTRAVENOUS | Status: DC

## 2013-10-05 SURGICAL SUPPLY — 18 items
BAG URO CATCHER STRL LF (DRAPE) ×4 IMPLANT
BASKET LASER NITINOL 1.9FR (BASKET) IMPLANT
BASKET ZERO TIP NITINOL 2.4FR (BASKET) ×4 IMPLANT
CATH URET 5FR 28IN OPEN ENDED (CATHETERS) ×4 IMPLANT
CLOTH BEACON ORANGE TIMEOUT ST (SAFETY) ×4 IMPLANT
DRAPE CAMERA CLOSED 9X96 (DRAPES) ×4 IMPLANT
DRSG TELFA 4X5 ISLAND ADH (GAUZE/BANDAGES/DRESSINGS) ×4 IMPLANT
FIBER LASER FLEXIVA 365 (UROLOGICAL SUPPLIES) ×4 IMPLANT
GLOVE BIOGEL M STRL SZ7.5 (GLOVE) ×4 IMPLANT
GOWN STRL REUS W/TWL XL LVL3 (GOWN DISPOSABLE) ×4 IMPLANT
GUIDEWIRE ANG ZIPWIRE 038X150 (WIRE) ×4 IMPLANT
GUIDEWIRE STR DUAL SENSOR (WIRE) ×4 IMPLANT
PACK CYSTO (CUSTOM PROCEDURE TRAY) ×4 IMPLANT
STENT CONTOUR 6FRX26X.038 (STENTS) ×4 IMPLANT
SYRINGE 10CC LL (SYRINGE) ×4 IMPLANT
TUBE FEEDING 8FR 16IN STR KANG (MISCELLANEOUS) IMPLANT
TUBING CONNECTING 10 (TUBING) ×3 IMPLANT
TUBING CONNECTING 10' (TUBING) ×1

## 2013-10-05 NOTE — Transfer of Care (Signed)
Immediate Anesthesia Transfer of Care Note  Patient: NAVEN GIAMBALVO  Procedure(s) Performed: Procedure(s) (LRB): CYSTOSCOPY WITH RIGHT URETEROSCOPY AND STENT PLACEMENT (Right) RIGHT LASER LITHOTRIPSY (Right) TRANSURETHRAL RESECTION OF BLADDER TUMOR (TURBT) (N/A)  Patient Location: PACU  Anesthesia Type: General  Level of Consciousness: sedated, patient cooperative and responds to stimulation  Airway & Oxygen Therapy: Patient Spontanous Breathing and Patient connected to face mask oxgen  Post-op Assessment: Report given to PACU RN and Post -op Vital signs reviewed and stable  Post vital signs: Reviewed and stable  Complications: No apparent anesthesia complications

## 2013-10-05 NOTE — H&P (Signed)
Reason For Visit Follow-up for hematuria after undergoing a CT scan   History of Present Illness This is a 80 year old male, patient of Dr. Preston Fleeting, who is seen in the past for BPH and urinary retention, who presents today for a work in life or hematuria. The patient is not actively followed in our clinic for his BPH symptoms. He takes Cardura 8 mg daily. The patient was recently started on the Xarelto for paroxysmal A. fib.     Patient's symptoms began approximately 2 weeks ago with dark urine. He then had several episodes of gross hematuria over the last couple days. Patient did not have any symptoms with this hematuria including dysuria, suprapubic pain, flank pain, nausea/vomiting, fevers or chills. Patient has a history of kidney stones, he has had several kidney stone procedures proximally 10 years ago. He was told that he had a kidney stone was too big to pass and no further treatment was rendered, the stone has not been followed. Patient states that he has passed several stones. No past without having any associated symptoms.  Patient relates no associated lower urinary tract symptoms.    Patient has no known history of recent viral infections, has no known exposures to organic compounds or industrial solvents, no known history of recurrent UTIs, and has no family history of GU malignancy.    Interval: The patient has undergone a CT urogram, found bilateral nephrolithiasis, and a 5 mm right mid ureteral stone. The patient is not had any ongoing gross hematuria. He denies any flank pain or voiding symptoms. He does relate that he is slightly tender to palpation in the right lower quadrant. He denies any fevers or chills.   Past Medical History Problems  1. History of Arthritis (V13.4) 2. History of esophageal reflux (V12.79) 3. History of hypercholesterolemia (V12.29) 4. History of hypertension (V12.59)  Surgical History Problems  1. History of Appendectomy 2. History of  Cholecystectomy  Current Meds 1. Cardizem CD 180 MG Oral Capsule Extended Release 24 Hour;  Therapy: (Recorded:26Mar2008) to Recorded 2. Centrum Silver TABS;  Therapy: (Recorded:14Oct2011) to Recorded 3. Ecotrin Low Strength 81 MG Oral Tablet Delayed Release;  Therapy: (Recorded:26Mar2008) to Recorded 4. Fish Oil CAPS;  Therapy: (Recorded:14Oct2011) to Recorded 5. Furosemide 20 MG Oral Tablet;  Therapy: (Recorded:20Jul2010) to Recorded 6. Lipitor 20 MG Oral Tablet;  Therapy: (Recorded:26Mar2008) to Recorded 7. NexIUM 40 MG Oral Capsule Delayed Release; TAKE 1 CAPSULE BID;  Therapy: (Recorded:14Oct2011) to Recorded 8. Toprol XL 50 MG Oral Tablet Extended Release 24 Hour;  Therapy: (Recorded:26Mar2008) to Recorded 9. Vitamin C TABS;  Therapy: (Recorded:14Oct2011) to Recorded 10. Vitamin D TABS;   Therapy: (Recorded:14Oct2011) to Recorded 11. Xarelto TABS;   Therapy: (Recorded:27Jan2015) to Recorded  Allergies Medication  1. No Known Drug Allergies  Family History Problems  1. Family history of Family Health Status Number Of Children   3 sons 2. No pertinent family history : Mother  Social History Problems  1. Being A Social Drinker 2. Caffeine use (V49.89) 3. Former smoker (V15.82) 4. Marital History - Currently Married   84yrs 5. Married 6. Three children 7. Tobacco Use (V15.82)   quit 1970  Vitals Vital Signs [Data Includes: Last 1 Day]  Recorded: [id number] 03:44PM  Height: 5 ft 7.5 in Weight: 147 lb  BMI Calculated: 22.68 BSA Calculated: 1.78 Blood Pressure: 142 / 83 Temperature: 97.8 F Heart Rate: 102  Results/Data Urine [Data Includes: Last 1 Day]   [id number]  COLOR YELLOW   APPEARANCE CLOUDY  SPECIFIC GRAVITY 1.010   pH 5.5   GLUCOSE NEG mg/dL  BILIRUBIN NEG   KETONE NEG mg/dL  BLOOD LARGE   PROTEIN NEG mg/dL  UROBILINOGEN 0.2 mg/dL  NITRITE NEG   LEUKOCYTE ESTERASE NEG   SQUAMOUS EPITHELIAL/HPF RARE   WBC NONE SEEN WBC/hpf  RBC TNTC  RBC/hpf  BACTERIA RARE   CRYSTALS NONE SEEN   CASTS NONE SEEN   Selected Results  UA With REFLEX [id number] 03:11PM Louis Meckel  SPECIMEN TYPE: CLEAN CATCH   Test Name Result Flag Reference  COLOR YELLOW  YELLOW  APPEARANCE CLOUDY A CLEAR  SPECIFIC GRAVITY 1.010  1.005-1.030  pH 5.5  5.0-8.0  GLUCOSE NEG mg/dL  NEG  BILIRUBIN NEG  NEG  KETONE NEG mg/dL  NEG  BLOOD LARGE A NEG  PROTEIN NEG mg/dL  NEG  UROBILINOGEN 0.2 mg/dL  0.0-1.0  NITRITE NEG  NEG  LEUKOCYTE ESTERASE NEG  NEG  SQUAMOUS EPITHELIAL/HPF RARE  RARE  WBC NONE SEEN WBC/hpf  <3  RBC TNTC RBC/hpf A <3  BACTERIA RARE  RARE  CRYSTALS NONE SEEN  NONE SEEN  CASTS NONE SEEN  NONE SEEN   AU CT-STONE PROTOCOL [id number] 12:00AM Louis Meckel   Test Name Result Flag Reference  CT-STONE PROTOCOL (Report)    ** RADIOLOGY REPORT BY [facility] **   CLINICAL DATA: Gross hematuria. Urolithiasis.  EXAM: CT ABDOMEN AND PELVIS WITHOUT CONTRAST (URINARY CALCULUS PROTOCOL)  TECHNIQUE: Multidetector CT imaging was performed through the abdomen and pelvis without intravenous contrast to include the urinary tract.  COMPARISON: 02/07/2009  FINDINGS: Multiple small less than 1 cm calculi are again seen in both kidneys. Mild right hydronephrosis and ureterectasis is demonstrated with a 5 mm calculus in the mid pelvic portion of the right ureter. No other ureteral calculi identified. No evidence of left-sided hydronephrosis. No bladder calculi identified. Prior TURP defect noted.  Surgical clips from prior cholecystectomy noted. Mild pneumobilia noted without significant biliary dilatation. Noncontrast images of the liver are otherwise unremarkable in appearance. The pancreas, spleen, and adrenal glands also appear normal in this noncontrast study. No soft tissue masses or lymphadenopathy are identified.  A mild infrarenal abdominal aortic aneurysm is seen which measures 3.3 cm in maximum diameter  compared to 3.1 cm previously. No evidence of aneurysm leak or rupture. No evidence of acute inflammatory process or abnormal fluid collections. Diverticulosis is again noted, however there is no evidence of diverticulitis. No evidence of dilated bowel loops. Small right inguinal hernia noted containing only fat. No evidence of bowel containing hernia.  IMPRESSION: 5 mm calculus in the mid pelvic portion of right ureter, causing mild right hydroureteronephrosis. Other nonobstructive bilateral intrarenal calculi noted.  3.3 cm infrarenal abdominal aortic aneurysm, slightly increased in size compared to prior exam. Recommend follow up by [place] in 3 years. This recommendation follows ACR consensus guidelines: White Paper of the ACR Incidental Findings Committee II on Vascular Findings. Joellyn Rued [id number]; 56:314-970.  Small right inguinal hernia containing only fat.  Diverticulosis. No radiographic evidence of diverticulitis.   Electronically Signed  By: Earle Gell M.D.  On: 08/31/2013 13:01   URINE CULTURE [id number] 03:49PM Louis Meckel  SOURCE : CLEAN CATCH SPECIMEN TYPE: URINE   Test Name Result Flag Reference  CULTURE, URINE Culture, Urine    ===== COLONY COUNT: =====  NO GROWTH   FINAL REPORT: NO GROWTH   UA With REFLEX [id number] 02:07PM Louis Meckel  SPECIMEN TYPE: [place]   Test Name Result Flag Reference  COLOR BROWN A YELLOW  BIOCHEMICALS MAY BE AFFECTED BY THE COLOR OF THE URINE.  APPEARANCE CLOUDY A CLEAR  SPECIFIC GRAVITY 1.020  1.005-1.030  pH 5.5  5.0-8.0  GLUCOSE NEG mg/dL  NEG  BILIRUBIN SMALL A NEG  KETONE NEG mg/dL  NEG  BLOOD LARGE A NEG  PROTEIN 30 mg/dL A NEG  UROBILINOGEN 0.2 mg/dL  0.0-1.0  NITRITE NEG  NEG  LEUKOCYTE ESTERASE TRACE A NEG  SQUAMOUS EPITHELIAL/HPF NONE SEEN  RARE  WBC NONE SEEN WBC/hpf  <3  RBC TNTC RBC/hpf A <3  BACTERIA NONE SEEN  RARE  CRYSTALS NONE SEEN  NONE SEEN  CASTS NONE SEEN  NONE SEEN    Assessment Assessed  1. Benign prostatic hypertrophy without lower urinary tract symptoms (600.00) 2. Gross hematuria (599.71) 3. Right ureteral calculus (592.1)  Plan Health Maintenance  1. UA With REFLEX; [Do Not Release]; Status:Resulted - Requires Verification;   [id number] 03:11PM Right ureteral calculus  2. Follow-up Schedule Surgery Office  Follow-up  Status: Complete  Done: [id number]  Discussion/Summary I went over the patient's CAT scan was then we discussed the management of this right mid ureteral stone. I recommended stone be removed because of the risk and developing an infection behind stone and risk of kidney damage and the implications thereof. I suggested ureteroscopy/laser lithotripsy and stone removal with stent placement. This is a relatively low risk procedure, however, I would like to get medical clearance from his primary care provider. I do not think it would be necessary for the patient to stop his Xarelto. I went over all the risks and benefits of the operative procedure with the patient, he is agreeable to proceed. We will schedule the procedure in one month, which are give him time to pass the stone. He is already on Flomax, so medical expulsion therapy as are being used.

## 2013-10-05 NOTE — Anesthesia Postprocedure Evaluation (Signed)
Anesthesia Post Note  Patient: Jeff Barrett  Procedure(s) Performed: Procedure(s) (LRB): CYSTOSCOPY WITH RIGHT URETEROSCOPY AND STENT PLACEMENT (Right) RIGHT LASER LITHOTRIPSY (Right) TRANSURETHRAL RESECTION OF BLADDER TUMOR (TURBT) (N/A)  Anesthesia type: General  Patient location: PACU  Post pain: Pain level controlled  Post assessment: Post-op Vital signs reviewed  Last Vitals:  Filed Vitals:   10/05/13 1226  BP: 125/57  Pulse: 62  Temp: 36.3 C  Resp: 16    Post vital signs: Reviewed  Level of consciousness: sedated  Complications: No apparent anesthesia complications

## 2013-10-05 NOTE — Preoperative (Signed)
Beta Blockers   Reason not to administer Beta Blockers:Not Applicable, took metroprolol 10/05/13 am.

## 2013-10-05 NOTE — Progress Notes (Signed)
Dr. Winfred Leeds, anesthes, notified of elevated heart rate 99-100, BP 117/74; will continue to observe

## 2013-10-05 NOTE — Op Note (Signed)
Preoperative diagnosis: right ureteral calculus  Postoperative diagnosis: right ureteral calculus, small bladder tumor 0.5-2cm  Procedure:  1. Cystoscopy 2. right ureteroscopy and stone removal 3. Ureteroscopic laser lithotripsy 4. right 680F x 26 ureteral stent placement  5. right retrograde pyelography with interpretation 6. Transurethral resection of bladder tumor ~1cm with fulguration.  Surgeon: Ardis Hughs, MD  Anesthesia: General  Complications: None  Intraoperative findings:  1. right retrograde pyelography demonstrated a filling defect within the right ureter consistent with the patient's known calculus without other abnormalities. 2. j-hooking distal ureter consistent with enlarged prostate 3. ~1cm papillary lesion noted just posterolateral  to the right ureteral orifice.  EBL: Minimal  Specimens: 1. right ureteral calculus  2. Bladder tumor  Disposition of specimens: stone was brought to [facility] for stone analysis, bladder tumor was sent to pathology lab  Indication: Jeff Barrett is a 80 y.o.   patient with urolithiasis. After reviewing the management options for treatment, the patient elected to proceed with the above surgical procedure(s). We have discussed the potential benefits and risks of the procedure, side effects of the proposed treatment, the likelihood of the patient achieving the goals of the procedure, and any potential problems that might occur during the procedure or recuperation. Informed consent has been obtained.  Description of procedure:  The patient was taken to the operating room and general anesthesia was induced.  The patient was placed in the dorsal lithotomy position, prepped and draped in the usual sterile fashion, and preoperative antibiotics were administered. A preoperative time-out was performed.   Cystourethroscopy was performed.  The patient's urethra was examined and demonstrated bilobar prostatic  hypertrophy. The bladder was then systematically examined in its entirety. There were no evidence for any stones.  There was tumor noted which was taken care of at the end of the case.  No additional pathology was noted.  Attention then turned to the right ureteral orifice and a ureteral catheter was used to intubate the ureteral orifice.  Omnipaque contrast was injected through the ureteral catheter and a retrograde pyelogram was performed with findings as dictated above.  A 0.38 sensor guidewire was then advanced into the right ureter, but strong resistance was met.  I then tried an angled wire unsuccessfully.  I then navigated the urethra and cannulated the right ureter with a 680F semirigid ureteroscope and encountered the stone at the UVJ.  I was able to pass a 0.38 sensor wire through the ureteroscope and into the renal pelvis under fluoroscopic guidance.   The stone was then fragmented with the 365 micron holmium laser fiber on a setting of 0.6 and frequency of 6 Hz.   All stones were then removed from the ureter with a zero-tip basket.  Reinspection of the ureter revealed no remaining visible stones or fragments.  I was able to navigate beyond/proximal to the pelvis, but there were no stones seen on retrograde.  The wire was then backloaded through the cystoscope and a ureteral stent was advance over the wire using Seldinger technique.  The stent was positioned appropriately under fluoroscopic and cystoscopic guidance.  The wire was then removed with an adequate stent curl noted in the renal pelvis as well as in the bladder.  Using the biopsy forceps I then removed the tumor and sent it to pathology.  The tumor base was then fulgurated using the [place].  Hemostasis was excellent.  The bladder was then emptied and the procedure ended.  The patient appeared to tolerate the  procedure well and without complications.  The patient was able to be awakened and transferred to the recovery unit in  satisfactory condition.   Disposition:  The patient will be scheduled for stent removal in 5-7 days in our clinic.

## 2013-10-05 NOTE — Discharge Instructions (Signed)
DISCHARGE INSTRUCTIONS FOR KIDNEY STONES OR URETERAL STENT   MEDICATIONS:  1. DO NOT RESUME YOUR ASPIRIN, or any other medicines like ibuprofen, motrin, excedrin, advil, aleve, vitamin E, fish oil as these can all cause bleeding x 7 days.  2. Resume all your other meds from home - except do not take any other pain meds that you may have at home.  3. Tylenol with codeine is for moderate/severe pain, otherwise taking upto 1000 mg every 6 hours of plainTylenol will help treat your pain.  Do not take both at the same time.  ACTIVITY:  1. No strenuous activity x 1week  2. No driving while on narcotic pain medications  3. Drink plenty of water  4. Continue to walk at home - you can still get blood clots when you are at home, so keep active, but don't over do it.  5. May return to work/school tomorrow or when you feel ready   BATHING:  1. You can shower and we recommend daily showers    SIGNS/SYMPTOMS TO CALL:  Please call us if you have a fever greater than 101.5, uncontrolled nausea/vomiting, uncontrolled pain, dizziness, unable to urinate, bloody urine, chest pain, shortness of breath, leg swelling, leg pain, redness around wound, drainage from wound, or any other concerns or questions.   You can reach [place] at [phone].   FOLLOW-UP:  1. You have been scheduled for follow-up in 1 week for removal of your stent in the office.

## 2013-10-05 NOTE — Progress Notes (Signed)
Update to Dr. Winfred Leeds, pt's heart rate varies from 50's to 100; sinus brady with 1st degree block to sinus tach; OK to go to short stay for discharge home; pt very alert, talkative, BP 128/70

## 2013-10-05 NOTE — Anesthesia Preprocedure Evaluation (Signed)
Anesthesia Evaluation  Patient identified by MRN, date of birth, ID band Patient awake    Reviewed: Allergy & Precautions, H&P , NPO status , Patient's Chart, lab work & pertinent test results, reviewed documented beta blocker date and time   Airway Mallampati: I TM Distance: >3 FB Neck ROM: full    Dental  (+) Edentulous Upper, Caps,    Pulmonary shortness of breath, former smoker,    Pulmonary exam normal       Cardiovascular hypertension, Pt. on medications + dysrhythmias Atrial Fibrillation + Valvular Problems/Murmurs AS Rhythm:Regular Rate:Normal + Systolic murmurs 2D Echo - EF >55%; Nuclear Stress Test 01/14/2010 - EKG negative for ischemia, normal myocardial perfusion study   Neuro/Psych negative neurological ROS  negative psych ROS   GI/Hepatic Neg liver ROS,   Endo/Other  negative endocrine ROS  Renal/GU Renal diseasenegative Renal ROS     Musculoskeletal   Abdominal Normal abdominal exam  (+) - obese,   Peds  Hematology  (+) anemia ,   Anesthesia Other Findings   Reproductive/Obstetrics                           Anesthesia Physical Anesthesia Plan  ASA: III  Anesthesia Plan: General   Post-op Pain Management:    Induction: Intravenous  Airway Management Planned: LMA  Additional Equipment:   Intra-op Plan:   Post-operative Plan: Extubation in OR  Informed Consent: I have reviewed the patients History and Physical, chart, labs and discussed the procedure including the risks, benefits and alternatives for the proposed anesthesia with the patient or authorized representative who has indicated his/her understanding and acceptance.   Dental advisory given  Plan Discussed with: CRNA  Anesthesia Plan Comments:         Anesthesia Quick Evaluation

## 2013-10-29 ENCOUNTER — Ambulatory Visit (INDEPENDENT_AMBULATORY_CARE_PROVIDER_SITE_OTHER): Payer: Medicare Other | Admitting: Pharmacist Clinician (PhC)/ Clinical Pharmacy Specialist

## 2013-10-29 ENCOUNTER — Encounter: Payer: Self-pay | Admitting: Pharmacist Clinician (PhC)/ Clinical Pharmacy Specialist

## 2013-10-29 VITALS — BP 110/54 | HR 52

## 2013-10-29 DIAGNOSIS — I10 Essential (primary) hypertension: Secondary | ICD-10-CM

## 2013-10-29 NOTE — Patient Instructions (Signed)
Return for a a follow up appointment in June with one of our PAs.  You will get a call about this in another month or so.  Your blood pressure today is 110/54   Your heart rate was 52  Take your BP meds as follows: cut your metoprolol from 25mg  to 12.5mg  each morning (take only 1/2 tablet)

## 2013-10-29 NOTE — Progress Notes (Signed)
10/29/2013 Jeff Barrett Jul 12, 1921 [id number]   HPI:  Jeff Barrett is a 80 y.o. male patient of Dr Gwenlyn Found, with a PMH below who presents today for a blood pressure check.  Jeff Barrett saw Dr. Gwenlyn Found on March 2 and his dose of metoprolol was increased from 25 mg to 50 mg daily due to bradycardia.  However 2 days later, while at the hospital he was hypotensive at 88/47.  He was asked to decrease metoprolol back to 25mg  daily.  He has continued on this dose since that time.  He has has problems with sinus tachycardia in the past as well.  Jeff Barrett has no complaints of dizziness.  He quit smoking in April 21, 1969, when his father died from complications of emphysema.  He drinks occasional alcohol.  He states compliance with medications.     Current Outpatient Prescriptions  Medication Sig Dispense Refill  . acetaminophen-codeine (TYLENOL #3) 300-30 MG per tablet Take 1 tablet by mouth every 4 (four) hours as needed.  20 tablet  0  . aspirin 81 MG EC tablet Take 81 mg by mouth daily.       Marland Kitchen atorvastatin (LIPITOR) 20 MG tablet Take 20 mg by mouth daily.      . Biotin 300 MCG TABS Take 500 mcg by mouth daily.      . cholecalciferol (VITAMIN D) 1000 UNITS tablet Take 1,000 Units by mouth daily.      Marland Kitchen diltiazem (DILACOR XR) 120 MG 24 hr capsule Take 120 mg by mouth every morning.      Marland Kitchen doxazosin (CARDURA) 4 MG tablet Take 0.5 tablets (2 mg total) by mouth at bedtime.      . fish oil-omega-3 fatty acids 1000 MG capsule Take 1 g by mouth daily.      . flunisolide (NASALIDE) 25 MCG/ACT (0.025%) SOLN Inhale 2 sprays into the lungs 2 (two) times daily.      . furosemide (LASIX) 20 MG tablet Take 20 mg by mouth daily.       . Ipratropium-Albuterol (COMBIVENT RESPIMAT) 20-100 MCG/ACT AERS respimat Inhale 1 puff into the lungs every 6 (six) hours as needed for wheezing.       . metoprolol succinate (TOPROL XL) 25 MG 24 hr tablet Take 1 tablet (25 mg total) by mouth daily.  30 tablet  11  .  Multiple Vitamin (MULTIVITAMIN WITH MINERALS) TABS tablet Take 1 tablet by mouth daily.      Marland Kitchen omeprazole (PRILOSEC) 20 MG capsule Take 20 mg by mouth daily.      . Rivaroxaban (XARELTO) 15 MG TABS tablet Take 15 mg by mouth every evening.      . traMADol (ULTRAM) 50 MG tablet Take 1-2 tablets (50-100 mg total) by mouth every 6 (six) hours as needed for pain.  30 tablet  1  . vitamin C (ASCORBIC ACID) 500 MG tablet Take 500 mg by mouth daily.       No current facility-administered medications for this visit.    No Known Allergies  Past Medical History  Diagnosis Date  . Shortness of breath on exertion 01/15/2010    2D Echo - EF >55%; Nuclear Stress Test 01/14/2010 - EKG negative for ischemia, normal myocardial perfusion study  . Hypertension   . Hyperlipidemia   . Mild aortic stenosis   . Paroxysmal atrial fibrillation     rate controlled on diltiazem and anticoagulated on Xarelto to  . Allergy   . History of  kidney stones   . Hematuria     Blood pressure 110/54, pulse 52.Standing 108/52.   ASSESSMENT AND PLAN: Today Jeff Barrett' blood pressure is low normal.  However his heart rate is low at 52.  He is taking both metoprolol 25mg  and diltiazem 120mg  once daily.  He also takes doxazosin 2mg  daily.  For now I am going to decrease his metoprolol to 12.5mg  once daily in hopes of increasing his heart rate some.  He is due for a follow up with Lurena Joiner in June.  If his blood pressure remains low normal, I would suggest that he discontinue doxasosin.  With his age he is at an increased risk of orthostatic hypotension from that medication.    Tommy Medal PharmD CPP [facility]

## 2014-04-05 ENCOUNTER — Ambulatory Visit (INDEPENDENT_AMBULATORY_CARE_PROVIDER_SITE_OTHER): Payer: Medicare Other | Admitting: Physician Assistant

## 2014-04-05 ENCOUNTER — Encounter: Payer: Self-pay | Admitting: Physician Assistant

## 2014-04-05 VITALS — BP 122/70 | HR 48 | Ht 67.0 in | Wt 150.9 lb

## 2014-04-05 DIAGNOSIS — I4891 Unspecified atrial fibrillation: Secondary | ICD-10-CM

## 2014-04-05 DIAGNOSIS — I480 Paroxysmal atrial fibrillation: Secondary | ICD-10-CM

## 2014-04-05 DIAGNOSIS — E785 Hyperlipidemia, unspecified: Secondary | ICD-10-CM

## 2014-04-05 DIAGNOSIS — Z7901 Long term (current) use of anticoagulants: Secondary | ICD-10-CM

## 2014-04-05 DIAGNOSIS — I519 Heart disease, unspecified: Secondary | ICD-10-CM

## 2014-04-05 DIAGNOSIS — I5189 Other ill-defined heart diseases: Secondary | ICD-10-CM

## 2014-04-05 DIAGNOSIS — I10 Essential (primary) hypertension: Secondary | ICD-10-CM

## 2014-04-05 MED ORDER — RIVAROXABAN 15 MG PO TABS: 15.0000 mg | ORAL_TABLET | Freq: Every evening | ORAL | Status: DC

## 2014-04-05 NOTE — Assessment & Plan Note (Signed)
Continue fish oil and statin

## 2014-04-05 NOTE — Assessment & Plan Note (Signed)
Maintaining sinus bradycardia first degree AV block.

## 2014-04-05 NOTE — Patient Instructions (Signed)
1.  Keep up the good work! 2.  Followup with Dr. Gwenlyn Found in 6 months

## 2014-04-05 NOTE — Assessment & Plan Note (Signed)
Pressure well controlled.

## 2014-04-05 NOTE — Progress Notes (Signed)
Date:  04/05/2014   ID:  Jeff Barrett, DOB 05-Jul-1921, MRN [id number]  PCP:  Thressa Sheller, MD  Primary Cardiologist:  Gwenlyn Found     History of Present Illness: Jeff Barrett is a 79 y.o. male who had a lap chole 04/21/13. 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 Last year his son took the family to tour [place] and the battlefields. He has a past medical history significant for HTN, dyslipidemia, and PAF which was noted this past spring. He had been holding NSR on Diltiazem. His LOV was June 2014. He is on Xarelto. He has no history of CAD or MI. A Myoview done in June 2011 was low risk. Echo in June 2014 showed an EF of 55-65% with grade 2 diastolic dysfunction. 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 We saw him peri-op. He did have some bradycardia and hypotension pre-op and his Diltiazem and Cardura were cut back. His Xarelto was held for 48 hrs pre op. Post op he did well. For some reason at discharge he was sent home on his original dose of Diltiazem 240 mg and Diltiazem 60 mg BID. Fortunately he has not had any syncope.   Patient presents today for 6 month evaluation. He reports doing quite well he does have some right greater than left lower extremity edema which is chronic.  Continues to take Lasix. He was also seen recently by his dermatologist for the annual check a lesion on his nose frozen off. No excessive bleeding was noted.  he patient currently denies nausea, vomiting, fever, chest pain, shortness of breath, orthopnea, dizziness, PND, cough, congestion, abdominal pain, hematochezia, melena, lower extremity edema, claudication.  Wt Readings from Last 3 Encounters:  04/05/14 150 lb 14.4 oz (68.448 kg)  10/03/13 153 lb (69.4 kg)  10/01/13 152 lb 9.6 oz (69.219 kg)     Past Medical History  Diagnosis Date  . Shortness of breath on exertion 01/15/2010    2D Echo - EF >55%; Nuclear Stress Test 01/14/2010 - EKG negative  for ischemia, normal myocardial perfusion study  . Hypertension   . Hyperlipidemia   . Mild aortic stenosis   . Paroxysmal atrial fibrillation     rate controlled on diltiazem and anticoagulated on Xarelto to  . Allergy   . History of kidney stones   . Hematuria     Current Outpatient Prescriptions  Medication Sig Dispense Refill  . acetaminophen-codeine (TYLENOL #3) 300-30 MG per tablet Take 1 tablet by mouth every 4 (four) hours as needed.  20 tablet  0  . aspirin 81 MG EC tablet Take 81 mg by mouth daily.       Marland Kitchen atorvastatin (LIPITOR) 20 MG tablet Take 20 mg by mouth daily.      . Biotin 300 MCG TABS Take 500 mcg by mouth daily.      . cholecalciferol (VITAMIN D) 1000 UNITS tablet Take 1,000 Units by mouth daily.      Marland Kitchen diltiazem (DILACOR XR) 120 MG 24 hr capsule Take 120 mg by mouth every morning.      Marland Kitchen doxazosin (CARDURA) 4 MG tablet Take 0.5 tablets (2 mg total) by mouth at bedtime.      . fish oil-omega-3 fatty acids 1000 MG capsule Take 1 g by mouth daily.      . flunisolide (NASALIDE) 25 MCG/ACT (0.025%) SOLN Inhale 2 sprays into the lungs 2 (two) times daily.      Marland Kitchen  furosemide (LASIX) 20 MG tablet Take 20 mg by mouth daily.       . Ipratropium-Albuterol (COMBIVENT RESPIMAT) 20-100 MCG/ACT AERS respimat Inhale 1 puff into the lungs every 6 (six) hours as needed for wheezing.       . metoprolol succinate (TOPROL XL) 25 MG 24 hr tablet Take 1 tablet (25 mg total) by mouth daily.  30 tablet  11  . Multiple Vitamin (MULTIVITAMIN WITH MINERALS) TABS tablet Take 1 tablet by mouth daily.      Marland Kitchen omeprazole (PRILOSEC) 20 MG capsule Take 20 mg by mouth daily.      . Rivaroxaban (XARELTO) 15 MG TABS tablet Take 1 tablet (15 mg total) by mouth every evening.  28 tablet  0  . traMADol (ULTRAM) 50 MG tablet Take 1-2 tablets (50-100 mg total) by mouth every 6 (six) hours as needed for pain.  30 tablet  1  . vitamin C (ASCORBIC ACID) 500 MG tablet Take 500 mg by mouth daily.       No  current facility-administered medications for this visit.    Allergies:   No Known Allergies  Social History:  The patient  reports that he quit smoking about 45 years ago. He quit smokeless tobacco use about 45 years ago. He reports that he drinks alcohol. He reports that he does not use illicit drugs.   Family history:   Family History  Problem Relation Age of Onset  . Lung disease Father     ROS:  Please see the history of present illness.  All other systems reviewed and negative.   PHYSICAL EXAM: VS:  BP 122/70  Pulse 48  Ht 5\' 7"  (1.702 m)  Wt 150 lb 14.4 oz (68.448 kg)  BMI 23.63 kg/m2 Well nourished, well developed, in no acute distress HEENT: Pupils are equal round react to light accommodation extraocular movements are intact.  Neck: no JVDNo cervical lymphadenopathy. Cardiac: 1/6 systolic murmur Regular rate and rhythm  Lungs:  clear to auscultation bilaterally, no wheezing, rhonchi or rales Abd: soft, nontender, positive bowel sounds all quadrants, no hepatosplenomegaly Ext: 1+ right greater than left lower extremity edema.  2+ radial and dorsalis pedis pulses. Skin: warm and dry Neuro:  Grossly normal  EKG:  Sinus bradycardia 40 beats per minute first degree AV block    ASSESSMENT AND PLAN:  Problem List Items Addressed This Visit   Paroxysmal atrial fibrillation (Chronic)     Maintaining sinus bradycardia first degree AV block.    Relevant Medications      rivaroxaban (XARELTO) tablet   Essential hypertension (Chronic)     Pressure well controlled    Relevant Medications      rivaroxaban (XARELTO) tablet   Hyperlipidemia (Chronic)     Continue fish oil and statin    Relevant Medications      rivaroxaban (XARELTO) tablet   Diastolic dysfunction- grade 2 (Chronic)     Mild lower extremity edema greater on the right. Continue Lasix. Otherwise appears euvolemic    Chronic anticoagulation- Xarelto for PAF (Chronic)    Other Visit Diagnoses   PAF  (paroxysmal atrial fibrillation)    -  Primary    Relevant Medications       rivaroxaban (XARELTO) tablet    Other Relevant Orders       EKG 12-Lead

## 2014-04-05 NOTE — Assessment & Plan Note (Signed)
Mild lower extremity edema greater on the right. Continue Lasix. Otherwise appears euvolemic

## 2014-07-08 ENCOUNTER — Ambulatory Visit (INDEPENDENT_AMBULATORY_CARE_PROVIDER_SITE_OTHER): Payer: Medicare Other | Admitting: Cardiovascular Disease

## 2014-07-08 ENCOUNTER — Encounter: Payer: Self-pay | Admitting: Cardiovascular Disease

## 2014-07-08 VITALS — BP 130/70 | HR 51 | Ht 68.0 in | Wt 152.2 lb

## 2014-07-08 DIAGNOSIS — I10 Essential (primary) hypertension: Secondary | ICD-10-CM

## 2014-07-08 DIAGNOSIS — E785 Hyperlipidemia, unspecified: Secondary | ICD-10-CM

## 2014-07-08 DIAGNOSIS — I480 Paroxysmal atrial fibrillation: Secondary | ICD-10-CM

## 2014-07-08 MED ORDER — RIVAROXABAN 15 MG PO TABS
15.0000 mg | ORAL_TABLET | Freq: Every day | ORAL | Status: DC
Start: 2014-07-08 — End: 2015-04-02

## 2014-07-08 NOTE — Progress Notes (Signed)
07/08/2014 Jeff Barrett   October 07, 1920  [id number]  Primary Physician Thressa Sheller, MD Primary Cardiologist: Lorretta Harp MD Renae Gloss   HPI:  Jeff Barrett is a pleasant 80y/o who is here today as a post hospital visit after he had a lap chole 04/21/13. He has been married for 72 years yrs. 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 2 years ago  his son took the family to tour [place] and the battlefields. He has a past medical history significant for HTN, dyslipidemia, and PAF which was noted this past spring. He had been holding NSR on Diltiazem. His LOV was June 2014. He is on Xarelto. He has no history of CAD or MI. A Myoview done in June 2011 was low risk. Echo in June 2014 showed an EF of 55-65% with grade 2 diastolic dysfunction. 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 We saw him peri-op. He did have some bradycardia and hypotension pre-op and his Diltiazem and Cardura were cut back. His Xarelto was held for 48 hrs pre op. Post op he did well, I believe he had some tachycardia early post op. For some reason at discharge he was sent home on his original dose of Diltiazem 240 mg and Diltiazem 60 mg BID. Fortunately he has not had any syncope. He saw Kerin Ransom [facility] back 05/01/13 postoperatively. He has been doing well since and denies chest pain shortness of breath or dizziness.since I saw him back in March he has remained clinically stable specifically denying chest pain or shortness of breath.  Current Outpatient Prescriptions  Medication Sig Dispense Refill  . acetaminophen-codeine (TYLENOL #3) 300-30 MG per tablet Take 1 tablet by mouth every 4 (four) hours as needed. 20 tablet 0  . aspirin 81 MG EC tablet Take 81 mg by mouth daily.     Marland Kitchen atorvastatin (LIPITOR) 20 MG tablet Take 20 mg by mouth daily.    . Biotin 300 MCG TABS Take 500 mcg by mouth daily.    . cholecalciferol (VITAMIN D)  1000 UNITS tablet Take 1,000 Units by mouth daily.    Marland Kitchen diltiazem (DILACOR XR) 120 MG 24 hr capsule Take 120 mg by mouth every morning.    Marland Kitchen doxazosin (CARDURA) 4 MG tablet Take 0.5 tablets (2 mg total) by mouth at bedtime.    . fish oil-omega-3 fatty acids 1000 MG capsule Take 1 g by mouth daily.    . flunisolide (NASALIDE) 25 MCG/ACT (0.025%) SOLN Inhale 2 sprays into the lungs 2 (two) times daily.    . furosemide (LASIX) 40 MG tablet Take 40 mg by mouth daily.  1  . Ipratropium-Albuterol (COMBIVENT RESPIMAT) 20-100 MCG/ACT AERS respimat Inhale 1 puff into the lungs every 6 (six) hours as needed for wheezing.     . metoprolol succinate (TOPROL XL) 25 MG 24 hr tablet Take 1 tablet (25 mg total) by mouth daily. 30 tablet 11  . Multiple Vitamin (MULTIVITAMIN WITH MINERALS) TABS tablet Take 1 tablet by mouth daily.    Marland Kitchen omeprazole (PRILOSEC) 20 MG capsule Take 20 mg by mouth daily.    . Rivaroxaban (XARELTO) 15 MG TABS tablet Take 1 tablet (15 mg total) by mouth every evening. 28 tablet 0  . vitamin C (ASCORBIC ACID) 500 MG tablet Take 500 mg by mouth daily.    . Rivaroxaban (XARELTO) 15 MG TABS tablet Take 1 tablet (15 mg  total) by mouth daily with supper. 42 tablet 0   No current facility-administered medications for this visit.    No Known Allergies  History   Social History  . Marital Status: Married    Spouse Name: N/A    Number of Children: N/A  . Years of Education: N/A   Occupational History  . Not on file.   Social History Main Topics  . Smoking status: Former Smoker    Quit date: 04/04/1969  . Smokeless tobacco: Former Systems developer    Quit date: 04/04/1969  . Alcohol Use: Yes     Comment: occasional - wine  . Drug Use: No  . Sexual Activity: No   Other Topics Concern  . Not on file   Social History Narrative     Review of Systems: General: negative for chills, fever, night sweats or weight changes.  Cardiovascular: negative for chest pain, dyspnea on exertion, edema,  orthopnea, palpitations, paroxysmal nocturnal dyspnea or shortness of breath Dermatological: negative for rash Respiratory: negative for cough or wheezing Urologic: negative for hematuria Abdominal: negative for nausea, vomiting, diarrhea, bright red blood per rectum, melena, or hematemesis Neurologic: negative for visual changes, syncope, or dizziness All other systems reviewed and are otherwise negative except as noted above.    Blood pressure 130/70, pulse 51, height 5\' 8"  (1.727 m), weight 152 lb 3.2 oz (69.037 kg).  General appearance: alert and no distress Neck: no adenopathy, no JVD, supple, symmetrical, trachea midline, thyroid not enlarged, symmetric, no tenderness/mass/nodules and soft left carotid bruit versus transmitted murmur Lungs: clear to auscultation bilaterally Heart: soft outflow tract murmur Extremities: extremities normal, atraumatic, no cyanosis or edema  EKG sinus bradycardia at 51 without ST or T-wave changes. I personally reviewed this EKG  ASSESSMENT AND PLAN:   Paroxysmal atrial fibrillation History of paroxysmal atrial fibrillation maintaining normal sinus rhythm on Xarelto  oral anticoagulation.  Essential hypertension History of hypertension with blood pressure measured at 130/70. He is on diltiazem X are 120 mg a day, Cardura 4 mg a day, Toprol-XL 25 mg a day Continue current meds at current dosing.  Hyperlipidemia History of hyperlipidemia on atorvastatin 20 mg a day, this is followed by his primary care physician. We will obtain a copy of his labs      Lorretta Harp MD [place], [facility] 07/08/2014 3:44 PM

## 2014-07-08 NOTE — Patient Instructions (Signed)
Your physician wants you to follow-up in 1 year with Dr. Berry. You will receive a reminder letter in the mail 2 months in advance. If you do not receive a letter, please call our office to schedule the follow-up appointment.  

## 2014-07-08 NOTE — Assessment & Plan Note (Signed)
History of hyperlipidemia on atorvastatin 20 mg a day, this is followed by his primary care physician. We will obtain a copy of his labs

## 2014-07-08 NOTE — Assessment & Plan Note (Signed)
History of paroxysmal atrial fibrillation maintaining normal sinus rhythm on Xarelto  oral anticoagulation.

## 2014-07-08 NOTE — Assessment & Plan Note (Signed)
History of hypertension with blood pressure measured at 130/70. He is on diltiazem X are 120 mg a day, Cardura 4 mg a day, Toprol-XL 25 mg a day Continue current meds at current dosing.

## 2014-08-29 ENCOUNTER — Ambulatory Visit: Payer: Medicare Other | Admitting: Cardiology

## 2014-10-04 ENCOUNTER — Ambulatory Visit: Payer: Medicare Other | Admitting: Cardiovascular Disease

## 2014-11-03 ENCOUNTER — Other Ambulatory Visit: Payer: Self-pay | Admitting: Cardiovascular Disease

## 2015-03-11 ENCOUNTER — Other Ambulatory Visit (HOSPITAL_COMMUNITY): Payer: Self-pay | Admitting: Internal Medicine

## 2015-03-11 DIAGNOSIS — I509 Heart failure, unspecified: Secondary | ICD-10-CM

## 2015-03-14 ENCOUNTER — Ambulatory Visit (HOSPITAL_COMMUNITY): Payer: Medicare Other

## 2015-03-18 ENCOUNTER — Ambulatory Visit: Payer: Medicare Other | Admitting: Cardiovascular Disease

## 2015-03-20 ENCOUNTER — Ambulatory Visit (HOSPITAL_COMMUNITY): Payer: Medicare Other | Attending: Internal Medicine

## 2015-03-20 ENCOUNTER — Other Ambulatory Visit: Payer: Self-pay

## 2015-03-20 DIAGNOSIS — I509 Heart failure, unspecified: Secondary | ICD-10-CM

## 2015-03-20 DIAGNOSIS — I517 Cardiomegaly: Secondary | ICD-10-CM | POA: Diagnosis not present

## 2015-03-20 DIAGNOSIS — I351 Nonrheumatic aortic (valve) insufficiency: Secondary | ICD-10-CM | POA: Diagnosis not present

## 2015-03-20 DIAGNOSIS — E785 Hyperlipidemia, unspecified: Secondary | ICD-10-CM | POA: Diagnosis not present

## 2015-03-20 DIAGNOSIS — I10 Essential (primary) hypertension: Secondary | ICD-10-CM | POA: Diagnosis not present

## 2015-04-02 ENCOUNTER — Ambulatory Visit (INDEPENDENT_AMBULATORY_CARE_PROVIDER_SITE_OTHER): Payer: Medicare Other | Admitting: Physician Assistant

## 2015-04-02 ENCOUNTER — Encounter: Payer: Self-pay | Admitting: Physician Assistant

## 2015-04-02 VITALS — BP 94/58 | HR 54 | Ht 67.0 in | Wt 139.5 lb

## 2015-04-02 DIAGNOSIS — I480 Paroxysmal atrial fibrillation: Secondary | ICD-10-CM | POA: Diagnosis not present

## 2015-04-02 DIAGNOSIS — R609 Edema, unspecified: Secondary | ICD-10-CM

## 2015-04-02 DIAGNOSIS — Z79899 Other long term (current) drug therapy: Secondary | ICD-10-CM

## 2015-04-02 LAB — BASIC METABOLIC PANEL
BUN: 28 mg/dL — ABNORMAL HIGH (ref 7–25)
CALCIUM: 9 mg/dL (ref 8.6–10.3)
CO2: 29 mmol/L (ref 20–31)
Chloride: 96 mmol/L — ABNORMAL LOW (ref 98–110)
Creat: 1.6 mg/dL — ABNORMAL HIGH (ref 0.70–1.11)
Glucose, Bld: 111 mg/dL — ABNORMAL HIGH (ref 65–99)
POTASSIUM: 4.4 mmol/L (ref 3.5–5.3)
Sodium: 136 mmol/L (ref 135–146)

## 2015-04-02 MED ORDER — RIVAROXABAN 15 MG PO TABS: 15.0000 mg | ORAL_TABLET | Freq: Every day | ORAL | Status: AC

## 2015-04-02 NOTE — Progress Notes (Signed)
Patient ID: Jeff Barrett, male   DOB: 08/20/1920, 81 y.o.   MRN: [id number]     Date:  04/02/2015   ID:  Jeff Barrett, DOB 1921-07-22, MRN [id number]  PCP:  Thressa Sheller, MD  Primary Cardiologist:  Gwenlyn Found  Chief Complaint  Patient presents with  . Follow-up    Bilateral ankle edema x 4 weeks. Dr. Noah Delaine increased furosemide to 80mg  daily.  recent Echo.  No complaints of SOB, chest pain. Mild positional lightheadedness.     History of Present Illness: Jeff Barrett is a 81 y.o. male who had a lap chole 04/21/13. 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. In 2014 his son took the family to tour [place] and the battlefields. He has a past medical history significant for HTN, dyslipidemia, and PAF which was noted this past spring. He had been holding NSR on Diltiazem.  He is on Xarelto. He has no history of CAD or MI. A Myoview done in June 2011 was low risk. Echo in June 2014 showed an EF of 55-65% with grade 2 diastolic dysfunction. 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 We saw him peri-op. He did have some bradycardia and hypotension pre-op and his Diltiazem and Cardura were cut back. His Xarelto was held for 48 hrs pre op. Post op he did well. For some reason at discharge he was sent home on his original dose of Diltiazem 240 mg and Diltiazem 60 mg BID. Fortunately he has not had any syncope.   Patient had a new echocardiogram 03/20/2015 which revealed an ejection fraction of 60-65% and normal wall motion. Aortic valve mobility was restricted the transvalvular velocity was minimally increased with no stenosis. Right atrium was mildly dilated.    Patient presents for 8 month evaluation. About 3 weeks ago he was started on double dose of Lasix because of severe lower extremity edema by Dr. Noah Delaine. According to his son this is improved considerably. Patient denies any orthopnea or PND. He does get a little bit dizzy with  position change.  Apparently had been eating fair amount of biscuits and gravy for about a week.    The patient currently denies nausea, vomiting, fever, chest pain, cough, congestion, abdominal pain, hematochezia, melena, claudication.  Wt Readings from Last 3 Encounters:  04/02/15 139 lb 8 oz (63.277 kg)  07/08/14 152 lb 3.2 oz (69.037 kg)  04/05/14 150 lb 14.4 oz (68.448 kg)     Past Medical History  Diagnosis Date  . Shortness of breath on exertion 01/15/2010    2D Echo - EF >55%; Nuclear Stress Test 01/14/2010 - EKG negative for ischemia, normal myocardial perfusion study  . Hypertension   . Hyperlipidemia   . Mild aortic stenosis   . Paroxysmal atrial fibrillation     rate controlled on diltiazem and anticoagulated on Xarelto to  . Allergy   . History of kidney stones   . Hematuria     Current Outpatient Prescriptions  Medication Sig Dispense Refill  . acetaminophen-codeine (TYLENOL #3) 300-30 MG per tablet Take 1 tablet by mouth every 4 (four) hours as needed. 20 tablet 0  . aspirin 81 MG EC tablet Take 81 mg by mouth daily.     Marland Kitchen atorvastatin (LIPITOR) 20 MG tablet Take 20 mg by mouth daily.    . Biotin 300 MCG TABS Take 500 mcg by mouth daily.    . cholecalciferol (VITAMIN D) 1000 UNITS tablet  Take 1,000 Units by mouth daily.    Marland Kitchen diltiazem (DILACOR XR) 120 MG 24 hr capsule Take 120 mg by mouth every morning.    Marland Kitchen doxazosin (CARDURA) 4 MG tablet Take 0.5 tablets (2 mg total) by mouth at bedtime.    . fish oil-omega-3 fatty acids 1000 MG capsule Take 1 g by mouth daily.    . flunisolide (NASALIDE) 25 MCG/ACT (0.025%) SOLN Inhale 2 sprays into the lungs 2 (two) times daily.    . furosemide (LASIX) 40 MG tablet Take 80 mg by mouth daily.   1  . Ipratropium-Albuterol (COMBIVENT RESPIMAT) 20-100 MCG/ACT AERS respimat Inhale 1 puff into the lungs every 6 (six) hours as needed for wheezing.     . metoprolol succinate (TOPROL XL) 25 MG 24 hr tablet Take 1 tablet (25 mg total) by  mouth daily. 30 tablet 11  . Multiple Vitamin (MULTIVITAMIN WITH MINERALS) TABS tablet Take 1 tablet by mouth daily.    Marland Kitchen omeprazole (PRILOSEC) 20 MG capsule Take 20 mg by mouth daily.    . vitamin C (ASCORBIC ACID) 500 MG tablet Take 500 mg by mouth daily.    Alveda Reasons 15 MG TABS tablet TAKE 1 TABLET BY MOUTH EVERY DAY WITH SUPPER 90 tablet 1   No current facility-administered medications for this visit.    Allergies:   No Known Allergies  Social History:  The patient  reports that he quit smoking about 46 years ago. He quit smokeless tobacco use about 46 years ago. He reports that he drinks alcohol. He reports that he does not use illicit drugs.   Family history:   Family History  Problem Relation Age of Onset  . Lung disease Father     ROS:  Please see the history of present illness.  All other systems reviewed and negative.   PHYSICAL EXAM: VS:  BP 94/58 mmHg  Pulse 54  Ht 5\' 7"  (1.702 m)  Wt 139 lb 8 oz (63.277 kg)  BMI 21.84 kg/m2 Well nourished, well developed, in no acute distress HEENT: Pupils are equal round react to light accommodation extraocular movements are intact.  Neck: no JVDNo cervical lymphadenopathy. Cardiac: Regular rate and rhythm without murmurs rubs or gallops. Lungs:  clear to auscultation bilaterally, no wheezing, rhonchi or rales Abd: soft, nontender, positive bowel sounds all quadrants, no hepatosplenomegaly Ext:1+ lower extremity edema.  2+ radial and dorsalis pedis pulses. Skin: warm and dry Neuro:  Grossly normal  EKG:    54 bpm  Assessment AND PLAN:  Paroxysmal atrial fibrillation:  Maintaining sinus bradycardia rate 54 bpm. Essential hypertension: Patient is hypotensive and was also a Dr. Noland Fordyce office. His weight is decreased from 152 pounds December to 139.  I'm going to discontinue the Cardizem altogether and just keep him on the metoprolol.  Hyperlipidemia Continue the Lipitor  Diastolic dysfunction, grade 2: Lasix was  recently increased to 80 mg daily by Dr. Noah Delaine due to significant lower extremity edema. This has improved considerably from what the patient and son described. Sounds like he was eating fair amount of sodium for a short period.  This was discussed.  We'll decrease back to 40 mg daily with an additional 40 when necessary for 3 pound weight gain in 24 hours or 5 pounds in a week.  We'll also check a basic metabolic panel today.   Add compression socks to help with the remaining lower extremity edema. Recent echo was essentially unchanged with good ejection fraction.   Ecchymosis and small,  firm hematoma in the area of the left tricep: Probably just bumped it at some point and with the Xarelto,  made it worse. Patient and son will keep an eye on it.

## 2015-04-02 NOTE — Addendum Note (Signed)
Addended by: Janett Labella A on: 04/02/2015 02:58 PM   Modules accepted: Orders

## 2015-04-02 NOTE — Patient Instructions (Addendum)
Medication Instructions:   STOP THE DILTIAZEM AND ASPIRIN 81MG .  DECREASE FUROSEMIDE BACK TO 40MG  DAILY.  YOU CAN TAKE AN EXTRA 40MG  IF YOU HAVE A 3 LB WEIGHT GAIN IN A DAY OR A 5 LB WEIGHT GAIN IN ONE WEEK.  Labwork:  TODAY AT [facility] ON THE FIRST FLOOR    Follow-Up:  6 MONTHS WITH DR. Gwenlyn Found  Any Other Special Instructions Will Be Listed Below (If Applicable).  COMPRESSION STOCKINGS - WEAR DURING THE DAY AND TAKE OFF AT NIGHT.  Low-Sodium Eating Plan Sodium raises blood pressure and causes water to be held in the body. Getting less sodium from food will help lower your blood pressure, reduce any swelling, and protect your heart, liver, and kidneys. We get sodium by adding salt (sodium chloride) to food. Most of our sodium comes from canned, boxed, and frozen foods. Restaurant foods, fast foods, and pizza are also very high in sodium. Even if you take medicine to lower your blood pressure or to reduce fluid in your body, getting less sodium from your food is important. WHAT IS MY PLAN? Most people should limit their sodium intake to 2,300 mg a day. Your health care provider recommends that you limit your sodium intake to _____2000_____ a day.  WHAT DO I NEED TO KNOW ABOUT THIS EATING PLAN? For the low-sodium eating plan, you will follow these general guidelines:  Choose foods with a % Daily Value for sodium of less than 5% (as listed on the food label).   Use salt-free seasonings or herbs instead of table salt or sea salt.   Check with your health care provider or pharmacist before using salt substitutes.   Eat fresh foods.  Eat more vegetables and fruits.  Limit canned vegetables. If you do use them, rinse them well to decrease the sodium.   Limit cheese to 1 oz (28 g) per day.   Eat lower-sodium products, often labeled as "lower sodium" or "no salt added."  Avoid foods that contain monosodium glutamate (MSG). MSG is sometimes added to [place] food and some  canned foods.  Check food labels (Nutrition Facts labels) on foods to learn how much sodium is in one serving.  Eat more home-cooked food and less restaurant, buffet, and fast food.  When eating at a restaurant, ask that your food be prepared with less salt or none, if possible.  HOW DO I READ FOOD LABELS FOR SODIUM INFORMATION? The Nutrition Facts label lists the amount of sodium in one serving of the food. If you eat more than one serving, you must multiply the listed amount of sodium by the number of servings. Food labels may also identify foods as:  Sodium free--Less than 5 mg in a serving.  Very low sodium--35 mg or less in a serving.  Low sodium--140 mg or less in a serving.  Light in sodium--50% less sodium in a serving. For example, if a food that usually has 300 mg of sodium is changed to become light in sodium, it will have 150 mg of sodium.  Reduced sodium--25% less sodium in a serving. For example, if a food that usually has 400 mg of sodium is changed to reduced sodium, it will have 300 mg of sodium. WHAT FOODS CAN I EAT? Grains Low-sodium cereals, including oats, puffed wheat and rice, and shredded wheat cereals. Low-sodium crackers. Unsalted rice and pasta. Lower-sodium bread.  Vegetables Frozen or fresh vegetables. Low-sodium or reduced-sodium canned vegetables. Low-sodium or reduced-sodium tomato sauce and paste. Low-sodium or  reduced-sodium tomato and vegetable juices.  Fruits Fresh, frozen, and canned fruit. Fruit juice.  Meat and Other Protein Products Low-sodium canned tuna and salmon. Fresh or frozen meat, poultry, seafood, and fish. Lamb. Unsalted nuts. Dried beans, peas, and lentils without added salt. Unsalted canned beans. Homemade soups without salt. Eggs.  Dairy Milk. Soy milk. Ricotta cheese. Low-sodium or reduced-sodium cheeses. Yogurt.  Condiments Fresh and dried herbs and spices. Salt-free seasonings. Onion and garlic powders. Low-sodium  varieties of mustard and ketchup. Lemon juice.  Fats and Oils Reduced-sodium salad dressings. Unsalted butter.  Other Unsalted popcorn and pretzels.  The items listed above may not be a complete list of recommended foods or beverages. Contact your dietitian for more options. WHAT FOODS ARE NOT RECOMMENDED? Grains Instant hot cereals. Bread stuffing, pancake, and biscuit mixes. Croutons. Seasoned rice or pasta mixes. Noodle soup cups. Boxed or frozen macaroni and cheese. Self-rising flour. Regular salted crackers. Vegetables Regular canned vegetables. Regular canned tomato sauce and paste. Regular tomato and vegetable juices. Frozen vegetables in sauces. Salted french fries. Olives. Angie Fava. Relishes. Sauerkraut. Salsa. Meat and Other Protein Products Salted, canned, smoked, spiced, or pickled meats, seafood, or fish. Bacon, ham, sausage, hot dogs, corned beef, chipped beef, and packaged luncheon meats. Salt pork. Jerky. Pickled herring. Anchovies, regular canned tuna, and sardines. Salted nuts. Dairy Processed cheese and cheese spreads. Cheese curds. Blue cheese and cottage cheese. Buttermilk.  Condiments Onion and garlic salt, seasoned salt, table salt, and sea salt. Canned and packaged gravies. Worcestershire sauce. Tartar sauce. Barbecue sauce. Teriyaki sauce. Soy sauce, including reduced sodium. Steak sauce. Fish sauce. Oyster sauce. Cocktail sauce. Horseradish. Regular ketchup and mustard. Meat flavorings and tenderizers. Bouillon cubes. Hot sauce. Tabasco sauce. Marinades. Taco seasonings. Relishes. Fats and Oils Regular salad dressings. Salted butter. Margarine. Ghee. Bacon fat.  Other Potato and tortilla chips. Corn chips and puffs. Salted popcorn and pretzels. Canned or dried soups. Pizza. Frozen entrees and pot pies.  The items listed above may not be a complete list of foods and beverages to avoid. Contact your dietitian for more information. Document Released:  01/08/2002 Document Revised: 07/24/2013 Document Reviewed: 05/23/2013 [facility] Patient Information 2015 [place], [place]. This information is not intended to replace advice given to you by your health care provider. Make sure you discuss any questions you have with your health care provider.

## 2015-04-02 NOTE — Addendum Note (Signed)
Addended by: Janett Labella A on: 04/02/2015 03:06 PM   Modules accepted: Orders, Medications

## 2015-04-08 ENCOUNTER — Encounter: Payer: Self-pay | Admitting: *Deleted

## 2016-03-10 ENCOUNTER — Encounter (HOSPITAL_COMMUNITY): Payer: Self-pay

## 2016-03-10 ENCOUNTER — Emergency Department (HOSPITAL_COMMUNITY)
Admission: EM | Admit: 2016-03-10 | Discharge: 2016-03-10 | Disposition: A | Discharge status: Home / Self Care | Payer: Medicare Other | Attending: Physician Assistant | Admitting: Physician Assistant

## 2016-03-10 ENCOUNTER — Emergency Department (HOSPITAL_COMMUNITY): Payer: Medicare Other

## 2016-03-10 DIAGNOSIS — Z203 Contact with and (suspected) exposure to rabies: Secondary | ICD-10-CM | POA: Insufficient documentation

## 2016-03-10 DIAGNOSIS — Z79899 Other long term (current) drug therapy: Secondary | ICD-10-CM | POA: Diagnosis not present

## 2016-03-10 DIAGNOSIS — S51811A Laceration without foreign body of right forearm, initial encounter: Secondary | ICD-10-CM | POA: Diagnosis not present

## 2016-03-10 DIAGNOSIS — Y9301 Activity, walking, marching and hiking: Secondary | ICD-10-CM | POA: Diagnosis not present

## 2016-03-10 DIAGNOSIS — I10 Essential (primary) hypertension: Secondary | ICD-10-CM | POA: Insufficient documentation

## 2016-03-10 DIAGNOSIS — R51 Headache: Secondary | ICD-10-CM | POA: Diagnosis not present

## 2016-03-10 DIAGNOSIS — Y999 Unspecified external cause status: Secondary | ICD-10-CM | POA: Insufficient documentation

## 2016-03-10 DIAGNOSIS — S51812A Laceration without foreign body of left forearm, initial encounter: Secondary | ICD-10-CM | POA: Diagnosis not present

## 2016-03-10 DIAGNOSIS — S59912A Unspecified injury of left forearm, initial encounter: Secondary | ICD-10-CM | POA: Diagnosis present

## 2016-03-10 DIAGNOSIS — Z7901 Long term (current) use of anticoagulants: Secondary | ICD-10-CM | POA: Diagnosis not present

## 2016-03-10 DIAGNOSIS — Y92410 Unspecified street and highway as the place of occurrence of the external cause: Secondary | ICD-10-CM | POA: Diagnosis not present

## 2016-03-10 DIAGNOSIS — Z87891 Personal history of nicotine dependence: Secondary | ICD-10-CM | POA: Diagnosis not present

## 2016-03-10 DIAGNOSIS — W540XXA Bitten by dog, initial encounter: Secondary | ICD-10-CM | POA: Insufficient documentation

## 2016-03-10 DIAGNOSIS — Z23 Encounter for immunization: Secondary | ICD-10-CM

## 2016-03-10 DIAGNOSIS — T148XXA Other injury of unspecified body region, initial encounter: Secondary | ICD-10-CM

## 2016-03-10 LAB — COMPREHENSIVE METABOLIC PANEL WITH GFR
ALT: 21 U/L (ref 17–63)
AST: 36 U/L (ref 15–41)
Albumin: 2.8 g/dL — ABNORMAL LOW (ref 3.5–5.0)
Alkaline Phosphatase: 61 U/L (ref 38–126)
Anion gap: 7 (ref 5–15)
BUN: 25 mg/dL — ABNORMAL HIGH (ref 6–20)
CO2: 26 mmol/L (ref 22–32)
Calcium: 8.2 mg/dL — ABNORMAL LOW (ref 8.9–10.3)
Chloride: 103 mmol/L (ref 101–111)
Creatinine, Ser: 1.6 mg/dL — ABNORMAL HIGH (ref 0.61–1.24)
GFR calc Af Amer: 41 mL/min — ABNORMAL LOW (ref >60)
GFR calc non Af Amer: 36 mL/min — ABNORMAL LOW (ref >60)
Glucose, Bld: 108 mg/dL — ABNORMAL HIGH (ref 65–99)
Potassium: 3.8 mmol/L (ref 3.5–5.1)
Sodium: 136 mmol/L (ref 135–145)
Total Bilirubin: 0.5 mg/dL (ref 0.3–1.2)
Total Protein: 7.1 g/dL (ref 6.5–8.1)

## 2016-03-10 LAB — CBC WITH DIFFERENTIAL/PLATELET
BASOS ABS: 0 10*3/uL (ref 0.0–0.1)
BASOS PCT: 0 %
Eosinophils Absolute: 0.3 10*3/uL (ref 0.0–0.7)
Eosinophils Relative: 4 %
HEMATOCRIT: 29.5 % — AB (ref 39.0–52.0)
HEMOGLOBIN: 9.6 g/dL — AB (ref 13.0–17.0)
LYMPHS PCT: 19 %
Lymphs Abs: 1.4 10*3/uL (ref 0.7–4.0)
MCH: 31.8 pg (ref 26.0–34.0)
MCHC: 32.5 g/dL (ref 30.0–36.0)
MCV: 97.7 fL (ref 78.0–100.0)
MONO ABS: 0.5 10*3/uL (ref 0.1–1.0)
Monocytes Relative: 8 %
NEUTROS ABS: 5 10*3/uL (ref 1.7–7.7)
NEUTROS PCT: 69 %
PLATELETS: 174 10*3/uL (ref 150–400)
RBC: 3.02 MIL/uL — AB (ref 4.22–5.81)
RDW: 16.3 % — ABNORMAL HIGH (ref 11.5–15.5)
WBC: 7.2 10*3/uL (ref 4.0–10.5)

## 2016-03-10 LAB — TYPE AND SCREEN
ABO/RH(D): O POS
Antibody Screen: NEGATIVE

## 2016-03-10 LAB — I-STAT TROPONIN, ED: TROPONIN I, POC: 0.01 ng/mL (ref 0.00–0.08)

## 2016-03-10 LAB — PROTIME-INR
INR: 1.27
Prothrombin Time: 16 s — ABNORMAL HIGH (ref 11.4–15.2)

## 2016-03-10 LAB — ABO/RH: ABO/RH(D): O POS

## 2016-03-10 MED ORDER — AMOXICILLIN-POT CLAVULANATE 875-125 MG PO TABS
1.0000 | ORAL_TABLET | Freq: Two times a day (BID) | ORAL | 0 refills | Status: AC
Start: 2016-03-10 — End: ?

## 2016-03-10 MED ORDER — FENTANYL CITRATE (PF) 100 MCG/2ML IJ SOLN
12.5000 ug | Freq: Once | INTRAMUSCULAR | Status: AC
  Administered 2016-03-10: 12.5 ug via INTRAVENOUS
  Filled 2016-03-10: qty 2

## 2016-03-10 MED ORDER — RABIES VACCINE, PCEC IM SUSR
1.0000 mL | Freq: Once | INTRAMUSCULAR | Status: AC
  Administered 2016-03-10: 1 mL via INTRAMUSCULAR
  Filled 2016-03-10: qty 1

## 2016-03-10 MED ORDER — RABIES IMMUNE GLOBULIN 150 UNIT/ML IM INJ
20.0000 [IU]/kg | INJECTION | Freq: Once | INTRAMUSCULAR | Status: AC
  Administered 2016-03-10: 1275 [IU] via INTRAMUSCULAR
  Filled 2016-03-10: qty 8.5

## 2016-03-10 MED ORDER — TETANUS-DIPHTH-ACELL PERTUSSIS 5-2.5-18.5 LF-MCG/0.5 IM SUSP
0.5000 mL | Freq: Once | INTRAMUSCULAR | Status: AC
  Administered 2016-03-10: 0.5 mL via INTRAMUSCULAR
  Filled 2016-03-10: qty 0.5

## 2016-03-10 MED ORDER — SODIUM CHLORIDE 0.9 % IV BOLUS (SEPSIS)
1000.0000 mL | Freq: Once | INTRAVENOUS | Status: AC
  Administered 2016-03-10: 1000 mL via INTRAVENOUS

## 2016-03-10 MED ORDER — OXYCODONE-ACETAMINOPHEN 5-325 MG PO TABS: 1.0000 | ORAL_TABLET | Freq: Four times a day (QID) | ORAL | 0 refills | Status: AC | PRN

## 2016-03-10 NOTE — ED Provider Notes (Signed)
[place] DEPT Provider Note   CSN: [id number] Arrival date & time: 03/10/16  1443  First Provider Contact:  None       History   Chief Complaint Chief Complaint  Patient presents with  . Animal Bite    HPI Jeff Barrett is a 82 y.o. male.  Patient attcked by 2 dogs, fell.  No LOC, mild headache. Not on thinners.   Unknown dogs, unknown location of dogs    Fall  This is a new problem. The current episode started 1 to 2 hours ago. The problem occurs constantly. The problem has been gradually worsening. Associated symptoms include headaches. Pertinent negatives include no chest pain, no abdominal pain and no shortness of breath. The symptoms are aggravated by walking. Nothing relieves the symptoms. He has tried nothing for the symptoms.    Past Medical History:  Diagnosis Date  . Allergy   . Hematuria   . History of kidney stones   . Hyperlipidemia   . Hypertension   . Mild aortic stenosis   . Paroxysmal atrial fibrillation (HCC)    rate controlled on diltiazem and anticoagulated on Xarelto to  . Shortness of breath on exertion 01/15/2010   2D Echo - EF >55%; Nuclear Stress Test 01/14/2010 - EKG negative for ischemia, normal myocardial perfusion study    Patient Active Problem List   Diagnosis Date Noted  . Constipation, chronic 05/09/2013  . Protein-calorie malnutrition, severe ([place]) 04/20/2013  . Cholecystitis, acute- s/p Lap chole 04/21/13 04/18/2013  . Aortic sclerosis ([place]) 04/18/2013  . Diastolic dysfunction- grade 2 04/18/2013  . Chronic anticoagulation- Xarelto for PAF 04/18/2013  . Chronic renal insufficiency, stage III (moderate) 04/18/2013  . Paroxysmal atrial fibrillation ([place]) 12/19/2012  . Essential hypertension 12/19/2012  . Hyperlipidemia 12/19/2012    Past Surgical History:  Procedure Laterality Date  . APPENDECTOMY  02/07/2009   Dr Donne Hazel  . CATARACT EXTRACTION, BILATERAL    . CHOLECYSTECTOMY N/A 04/21/2013   Procedure: LAPAROSCOPIC  CHOLECYSTECTOMY WITH INTRAOPERATIVE CHOLANGIOGRAM;  Surgeon: Adin Hector, MD;  Location: [facility];  Service: General;  Laterality: N/A;  . CYSTOSCOPY WITH URETEROSCOPY AND STENT PLACEMENT Right 10/05/2013   Procedure: CYSTOSCOPY WITH RIGHT URETEROSCOPY AND STENT PLACEMENT;  Surgeon: Ardis Hughs, MD;  Location: [facility];  Service: Urology;  Laterality: Right;  . ERCP N/A 04/20/2013   Procedure: ENDOSCOPIC RETROGRADE CHOLANGIOPANCREATOGRAPHY (ERCP);  Surgeon: Beryle Beams, MD;  Location: [facility];  Service: Endoscopy;  Laterality: N/A;  . HOLMIUM LASER APPLICATION Right [id number]   Procedure: RIGHT LASER LITHOTRIPSY;  Surgeon: Ardis Hughs, MD;  Location: [facility];  Service: Urology;  Laterality: Right;  . TONSILLECTOMY    . TRANSURETHRAL RESECTION OF BLADDER TUMOR N/A 10/05/2013   Procedure: TRANSURETHRAL RESECTION OF BLADDER TUMOR (TURBT);  Surgeon: Ardis Hughs, MD;  Location: [facility];  Service: Urology;  Laterality: N/A;       Home Medications    Prior to Admission medications   Medication Sig Start Date End Date Taking? Authorizing Provider  acetaminophen-codeine (TYLENOL #3) 300-30 MG per tablet Take 1 tablet by mouth every 4 (four) hours as needed. 10/05/13   Ardis Hughs, MD  amoxicillin-clavulanate (AUGMENTIN) 875-125 MG tablet Take 1 tablet by mouth every 12 (twelve) hours. 03/10/16   Eyan Hagood Lyn Klayten Jolliff, MD  atorvastatin (LIPITOR) 20 MG tablet Take 20 mg by mouth daily.    Historical Provider, MD  Biotin 300 MCG TABS Take 500 mcg by mouth daily.    Historical Provider,  MD  cholecalciferol (VITAMIN D) 1000 UNITS tablet Take 1,000 Units by mouth daily.    Historical Provider, MD  doxazosin (CARDURA) 4 MG tablet Take 0.5 tablets (2 mg total) by mouth at bedtime. 04/24/13   Nita Sells, MD  fish oil-omega-3 fatty acids 1000 MG capsule Take 1 g by mouth daily.    Historical Provider, MD  flunisolide (NASALIDE) 25 MCG/ACT (0.025%) SOLN Inhale 2 sprays into the  lungs 2 (two) times daily.    Historical Provider, MD  furosemide (LASIX) 40 MG tablet Take 40 mg by mouth daily. Take an additional 40mg  if weight increase by 3 lbs in one day or 5 lbs in one week 06/04/14   Historical Provider, MD  Ipratropium-Albuterol (COMBIVENT RESPIMAT) 20-100 MCG/ACT AERS respimat Inhale 1 puff into the lungs every 6 (six) hours as needed for wheezing.     Historical Provider, MD  metoprolol succinate (TOPROL XL) 25 MG 24 hr tablet Take 1 tablet (25 mg total) by mouth daily. 10/03/13   Lorretta Harp, MD  Multiple Vitamin (MULTIVITAMIN WITH MINERALS) TABS tablet Take 1 tablet by mouth daily.    Historical Provider, MD  omeprazole (PRILOSEC) 20 MG capsule Take 20 mg by mouth daily.    Historical Provider, MD  oxyCODONE-acetaminophen (PERCOCET/ROXICET) 5-325 MG tablet Take 1 tablet by mouth every 6 (six) hours as needed for severe pain. 03/10/16   Machelle Raybon Lyn Lilias Lorensen, MD  Rivaroxaban (XARELTO) 15 MG TABS tablet Take 1 tablet (15 mg total) by mouth daily with supper. 04/02/15   Lorretta Harp, MD  vitamin C (ASCORBIC ACID) 500 MG tablet Take 500 mg by mouth daily.    Historical Provider, MD    Family History Family History  Problem Relation Age of Onset  . Lung disease Father     Social History Social History  Substance Use Topics  . Smoking status: Former Smoker    Quit date: 04/04/1969  . Smokeless tobacco: Former Systems developer    Quit date: 04/04/1969  . Alcohol use Yes     Comment: occasional - wine     Allergies   Review of patient's allergies indicates no known allergies.   Review of Systems Review of Systems  Constitutional: Negative for activity change.  Respiratory: Negative for shortness of breath.   Cardiovascular: Negative for chest pain.  Gastrointestinal: Negative for abdominal pain.  Skin: Positive for color change and wound.  Neurological: Positive for headaches.  All other systems reviewed and are negative.    Physical Exam Updated Vital  Signs BP 132/79 (BP Location: Right Arm)   Pulse 62   Resp 19   Wt 139 lb (63 kg)   SpO2 97%   BMI 21.77 kg/m   Physical Exam  Constitutional: He appears well-developed and well-nourished.  HENT:  Head: Normocephalic and atraumatic.  Eyes: Conjunctivae are normal.  Neck: Neck supple.  Cardiovascular: Normal rate and regular rhythm.   Murmur heard. Pulmonary/Chest: Effort normal and breath sounds normal. No respiratory distress.  Abdominal: Soft. There is no tenderness.  Musculoskeletal: He exhibits no edema.  Several large abrasions/lacerations to skin overlying bilateral forearms and hands.  Tendons visible in laceration overlying the left dorsum hand.  Neurological: He is alert.  Skin: Skin is warm and dry.  Greater than 12 lacerations superficial scattered over bilateral upper extremities hands and forearms. No evidence of tendon laceration weakness. Sensation intact.  Psychiatric: He has a normal mood and affect.  Nursing note and vitals reviewed.    ED Treatments /  Results  Labs (all labs ordered are listed, but only abnormal results are displayed) Labs Reviewed  COMPREHENSIVE METABOLIC PANEL - Abnormal; Notable for the following:       Result Value   Glucose, Bld 108 (*)    BUN 25 (*)    Creatinine, Ser 1.60 (*)    Calcium 8.2 (*)    Albumin 2.8 (*)    GFR calc non Af Amer 36 (*)    GFR calc Af Amer 41 (*)    All other components within normal limits  CBC WITH DIFFERENTIAL/PLATELET - Abnormal; Notable for the following:    RBC 3.02 (*)    Hemoglobin 9.6 (*)    HCT 29.5 (*)    RDW 16.3 (*)    All other components within normal limits  PROTIME-INR - Abnormal; Notable for the following:    Prothrombin Time 16.0 (*)    All other components within normal limits  I-STAT TROPOININ, ED  TYPE AND SCREEN  ABO/RH    EKG  EKG Interpretation None       Radiology Dg Chest 2 View  Result Date: 03/10/2016 CLINICAL DATA:  Trauma.  Attacked by dogs. EXAM: CHEST   2 VIEW COMPARISON:  None. FINDINGS: The heart size and mediastinal contours are within normal limits. Stable chronic lung disease. There is no evidence of pulmonary edema, consolidation, pneumothorax, nodule or pleural fluid. The visualized skeletal structures are unremarkable. IMPRESSION: No active cardiopulmonary disease. Electronically Signed   By: Aletta Edouard M.D.   On: 03/10/2016 16:31   Dg Forearm Left  Result Date: 03/10/2016 CLINICAL DATA:  Recent attacked by 2 dogs with multiple lacerations and pain EXAM: LEFT FOREARM - 2 VIEW COMPARISON:  None. FINDINGS: Mild soft tissue injury consistent with the known history is noted. No acute fracture or dislocation is noted. IMPRESSION: No acute bony abnormality seen. Electronically Signed   By: Inez Catalina M.D.   On: 03/10/2016 16:29   Dg Forearm Right  Result Date: 03/10/2016 CLINICAL DATA:  Recently attacked by dogs with forearm pain, initial encounter EXAM: RIGHT FOREARM - 2 VIEW COMPARISON:  None. FINDINGS: No acute fracture or dislocation is noted. Mild degenerative change in the radiocarpal joint is seen. No gross soft tissue abnormality is noted. IMPRESSION: No acute abnormality seen. Electronically Signed   By: Inez Catalina M.D.   On: 03/10/2016 16:30   Ct Head Wo Contrast  Result Date: 03/10/2016 CLINICAL DATA:  The patient fell today after being attacked by 2 dogs. Headache. EXAM: CT HEAD WITHOUT CONTRAST CT CERVICAL SPINE WITHOUT CONTRAST TECHNIQUE: Multidetector CT imaging of the head and cervical spine was performed following the standard protocol without intravenous contrast. Multiplanar CT image reconstructions of the cervical spine were also generated. COMPARISON:  Report of CT scan of the head dated 03/30/2002 FINDINGS: CT HEAD FINDINGS There is no acute intracranial hemorrhage, acute infarction, or intracranial mass lesion. There is diffuse fairly severe cerebral cortical atrophy, most prominent in the temporal lobes. Tiny old lacunar  infarcts adjacent to the frontal horn of the left lateral ventricle. No acute bone abnormality. Chronic mucosal thickening and probable polyps in the paranasal sinuses and nasal passage, described on the prior CT scan of 2003. CT CERVICAL SPINE FINDINGS There is no fracture no prevertebral soft tissue swelling. There is diffuse degenerative facet arthritis throughout the cervical spine with auto fusion of right facet joint and of the vertebral bodies at C5-6. Minimal subluxation of C3 on C4, C6 on C7 and C7 on  T1 felt to be due to facet arthritis. Benign 7 mm calcification in the inferior aspect of the right lobe of the thyroid gland. Chronic interstitial lung disease at the lung apices. IMPRESSION: 1. No acute intracranial abnormality. Severe atrophy. Old lacunar infarct. 2. No acute abnormality of the cervical spine. Multilevel degenerative disc and joint disease. Electronically Signed   By: Lorriane Shire M.D.   On: 03/10/2016 16:52   Ct Cervical Spine Wo Contrast  Result Date: 03/10/2016 CLINICAL DATA:  The patient fell today after being attacked by 2 dogs. Headache. EXAM: CT HEAD WITHOUT CONTRAST CT CERVICAL SPINE WITHOUT CONTRAST TECHNIQUE: Multidetector CT imaging of the head and cervical spine was performed following the standard protocol without intravenous contrast. Multiplanar CT image reconstructions of the cervical spine were also generated. COMPARISON:  Report of CT scan of the head dated 03/30/2002 FINDINGS: CT HEAD FINDINGS There is no acute intracranial hemorrhage, acute infarction, or intracranial mass lesion. There is diffuse fairly severe cerebral cortical atrophy, most prominent in the temporal lobes. Tiny old lacunar infarcts adjacent to the frontal horn of the left lateral ventricle. No acute bone abnormality. Chronic mucosal thickening and probable polyps in the paranasal sinuses and nasal passage, described on the prior CT scan of 2003. CT CERVICAL SPINE FINDINGS There is no fracture no  prevertebral soft tissue swelling. There is diffuse degenerative facet arthritis throughout the cervical spine with auto fusion of right facet joint and of the vertebral bodies at C5-6. Minimal subluxation of C3 on C4, C6 on C7 and C7 on T1 felt to be due to facet arthritis. Benign 7 mm calcification in the inferior aspect of the right lobe of the thyroid gland. Chronic interstitial lung disease at the lung apices. IMPRESSION: 1. No acute intracranial abnormality. Severe atrophy. Old lacunar infarct. 2. No acute abnormality of the cervical spine. Multilevel degenerative disc and joint disease. Electronically Signed   By: Lorriane Shire M.D.   On: 03/10/2016 16:52   Dg Hand Complete Left  Result Date: 03/10/2016 CLINICAL DATA:  Recently attacked by dog to with pain, initial encounter EXAM: LEFT HAND - COMPLETE 3+ VIEW COMPARISON:  None. FINDINGS: Degenerative changes at the first [facility] joint are noted. Mild interphalangeal degenerative changes are seen. There is a mildly undisplaced fracture at the base of the fourth distal phalanx. Some air is noted in the soft tissues adjacent to the fifth metacarpal consistent with the recent injury. IMPRESSION: Fourth distal phalangeal fracture. Mild soft tissue injury is noted as well. Electronically Signed   By: Inez Catalina M.D.   On: 03/10/2016 16:31   Dg Hand Complete Right  Result Date: 03/10/2016 CLINICAL DATA:  Recently attacked by dog with hand pain, initial encounter EXAM: RIGHT HAND - COMPLETE 3+ VIEW COMPARISON:  None. FINDINGS: Mild degenerative changes are noted at the first [facility] joint, the radiocarpal joint and the interphalangeal joints. No acute fracture or dislocation is noted. No significant soft tissue abnormality is noted. IMPRESSION: Degenerative changes without acute abnormality. Electronically Signed   By: Inez Catalina M.D.   On: 03/10/2016 16:32    Procedures Procedures (including critical care time)  Medications Ordered in ED Medications    sodium chloride 0.9 % bolus 1,000 mL (0 mLs Intravenous Stopped 03/10/16 1634)  Tdap (BOOSTRIX) injection 0.5 mL (0.5 mLs Intramuscular Given 03/10/16 1655)  fentaNYL (SUBLIMAZE) injection 12.5 mcg (12.5 mcg Intravenous Given 03/10/16 1654)  rabies vaccine (RABAVERT) injection 1 mL (1 mL Intramuscular Given 03/10/16 1925)  rabies immune globulin (  HYPERAB) injection 1,275 Units (1,275 Units Intramuscular Given 03/10/16 1931)     Initial Impression / Assessment and Plan / ED Course  I have reviewed the triage vital signs and the nursing notes.  Pertinent labs & imaging results that were available during my care of the patient were reviewed by me and considered in my medical decision making (see chart for details).  Clinical Course  Comment By Time  Xray shows distal fracture. Will get ring cutters. Will soak hand, will be unable to directly inject rabies give the paucity of tissue Elton Heid Julio Alm, MD 08/09 1719        Pt is a 82 year old male presenting with dog attack. Will give tetanus, rabies.   Patient has full range motion bilateral hands and forearms.don't suspect any injury requiring surgical.  Patient arrived in collar. We'll get CT head and neck. We'll get this and chest x-ray since fell. We'll get x-rays of the bilateral arms.  We will soak hands including solution. Patient is very thin skin as expected and 82 year old. I don't think that repairing these by stitching is going to be feasible. We will clean dry put Steri-Strips.  LACERATION REPAIR Performed by: Gardiner Sleeper Authorized by: Gardiner Sleeper Consent: Verbal consent obtained. Risks and benefits: risks, benefits and alternatives were discussed Consent given by: patient Patient identity confirmed: provided demographic data Prepped and Draped in normal sterile fashion Wound explored  Laceration Location:  Greater than 12  lacerations varying in size from 1 cm to 6 m. Jagged. Superficial  No Foreign  Bodies seen or palpated  Irrigation method: syringe, soaking.  Amount of cleaning: standard  Technique: dermabond Patient tolerance: Patient tolerated the procedure well with no immediate complications.   Laceration repair was very difficult given patient's thin skin. We used accommodation of Steri-Strips with Dermabond and Xeroform with Coban and gauze wrappings. We do not want to too tightly adhere wounds given the fact these are dog bites. In addition with the thin skin in this 82 year old gentleman skin integrity was limited.  SPLINT APPLICATION Date/Time: 123456 PM Authorized by: Gardiner Sleeper Consent: Verbal consent obtained. Risks and benefits: risks, benefits and alternatives were discussed Consent given by: patient Splint applied by: nurse and provider\ Location details: 4th finger, left buddy tapped  Post-procedure: The splinted body part was neurovascularly unchanged following the procedure. Patient tolerance: Patient tolerated the procedure well with no immediate complications.  Instructions given to given to patient and family about wound care, follow-up with urgent care for additional rabies shots, and follow-up about distal phalanx fracture. Antibiotics prescribed.       Final Clinical Impressions(s) / ED Diagnoses   Final diagnoses:  Need for rabies vaccination  Need for post exposure prophylaxis for rabies  Animal bite  Dog bite    New Prescriptions Discharge Medication List as of 03/10/2016  8:56 PM    START taking these medications   Details  amoxicillin-clavulanate (AUGMENTIN) 875-125 MG tablet Take 1 tablet by mouth every 12 (twelve) hours., Starting Wed 03/10/2016, Print    oxyCODONE-acetaminophen (PERCOCET/ROXICET) 5-325 MG tablet Take 1 tablet by mouth every 6 (six) hours as needed for severe pain., Starting Wed 03/10/2016, Print         Ardell Aaronson Julio Alm, MD 03/11/16 [phone]

## 2016-03-10 NOTE — ED Notes (Signed)
Ring cut off of pts finger

## 2016-03-10 NOTE — ED Notes (Signed)
The pts son states that animal control is picking up the dogs now and will be calling back within the next 15-20 minutes regarding the vaccination status of the dogs.

## 2016-03-10 NOTE — ED Notes (Signed)
Pts son stated that the police and animal control came to the pts house and spoke with them, he stated that they are currently looking for the dogs.

## 2016-03-10 NOTE — Discharge Instructions (Signed)
You were seen today after a dog bite. He will need to return for rabies vaccine. This will happen at                                      [facility] need to follow up with urgent care in (in the parking lot) and day 3, 7, and then subsequent days.   He has a broken finger. This likely will heal on its own. Please follow-up with hand surgery.  The medication may cause you to be dizzy so be sure to take while you are lying down.

## 2016-03-10 NOTE — ED Notes (Signed)
L ring finger buddy taped to L middle finger. Bilateral arms cleaned, steri-stripped/dermabonded. Xeroform applied, tube gauze, and coban applied.

## 2016-03-10 NOTE — ED Notes (Signed)
The pt and the pts son states that they would like to wait to get the vaccinations until they hear from animal control. Will hold vaccines at this time.

## 2016-03-10 NOTE — ED Triage Notes (Signed)
Per [facility]: The pt was walking down the street when he was attacked by two unknown dogs, the dogs dragged the pt to the ground. Pt denies any loss of consciousness. Pt does have a c-collar on. Per EMS the pt has "lacerations, abrasions, skin tears and avulsions and you can see the bones in the left hand". The pts hands and forearms are wrapped with gauze. Bleeding is controlled. The pt states that he takes 81 mg of ASA a day. EMS gave the pt 50 mcg of fentanyl for an 8/10 pain, the fentanyl decreased the pain to 6/10. The fentanyl did drop the pts blood pressure from 110/62 to 88/52, EMS then gave 400 ml of saline which increased the pts blood pressure to 105/69. The pt does not know when his last tetanus was. The dogs have not been found and it is not known if the dogs are up to date on rabies vaccinations.

## 2016-03-10 NOTE — ED Notes (Signed)
Patient transported to X-ray 

## 2016-03-10 NOTE — ED Notes (Signed)
Pts wounds cleaned.

## 2016-03-10 NOTE — ED Notes (Signed)
Pts son states that the neighbors have on video the dogs leaving the pts back yard and believes he knows where the dogs live. He states that animal control and the police should be on their way to the owners house.

## 2016-03-13 ENCOUNTER — Ambulatory Visit (HOSPITAL_COMMUNITY)
Admission: EM | Admit: 2016-03-13 | Discharge: 2016-03-13 | Disposition: A | Discharge status: Home / Self Care | Payer: Medicare Other | Attending: Family Medicine | Admitting: Family Medicine

## 2016-03-13 ENCOUNTER — Encounter (HOSPITAL_COMMUNITY): Payer: Self-pay | Admitting: *Deleted

## 2016-03-13 DIAGNOSIS — Z23 Encounter for immunization: Secondary | ICD-10-CM | POA: Diagnosis not present

## 2016-03-13 DIAGNOSIS — Z203 Contact with and (suspected) exposure to rabies: Secondary | ICD-10-CM | POA: Diagnosis not present

## 2016-03-13 DIAGNOSIS — Z2914 Encounter for prophylactic rabies immune globin: Secondary | ICD-10-CM

## 2016-03-13 MED ORDER — RABIES VACCINE, PCEC IM SUSR
1.0000 mL | Freq: Once | INTRAMUSCULAR | Status: AC
  Administered 2016-03-13: 1 mL via INTRAMUSCULAR

## 2016-03-13 MED ORDER — RABIES VACCINE, PCEC IM SUSR
INTRAMUSCULAR | Status: AC
  Filled 2016-03-13: qty 1

## 2016-03-13 NOTE — ED Triage Notes (Signed)
Presents for rabies vaccine.  BUE dressings in place; wound cleansing and dressing changes being performed by son.  Both deny any c/o's today.

## 2016-03-13 NOTE — Discharge Instructions (Signed)
Please return on 8/16 for next injection.

## 2016-03-15 ENCOUNTER — Encounter (HOSPITAL_COMMUNITY): Payer: Self-pay | Admitting: Emergency Medicine

## 2016-03-15 ENCOUNTER — Ambulatory Visit (HOSPITAL_COMMUNITY)
Admission: EM | Admit: 2016-03-15 | Discharge: 2016-03-15 | Disposition: A | Discharge status: Home / Self Care | Payer: Medicare Other | Attending: Family Medicine | Admitting: Family Medicine

## 2016-03-15 DIAGNOSIS — W540XXA Bitten by dog, initial encounter: Secondary | ICD-10-CM | POA: Diagnosis not present

## 2016-03-15 DIAGNOSIS — T148 Other injury of unspecified body region: Secondary | ICD-10-CM

## 2016-03-15 DIAGNOSIS — M79603 Pain in arm, unspecified: Secondary | ICD-10-CM | POA: Diagnosis not present

## 2016-03-15 DIAGNOSIS — T148XXA Other injury of unspecified body region, initial encounter: Secondary | ICD-10-CM

## 2016-03-15 MED ORDER — OXYCODONE-ACETAMINOPHEN 5-325 MG PO TABS: 1.0000 | ORAL_TABLET | ORAL | 0 refills | Status: AC | PRN

## 2016-03-15 NOTE — ED Triage Notes (Signed)
Patient here today for recheck of upper extremities and multiple wounds to this area.  Dressing was intact.  Dressings removed to  Visualize.

## 2016-03-15 NOTE — ED Provider Notes (Signed)
CSN: [id number]     Arrival date & time 03/15/16  1532 History   None    No chief complaint on file.  (Consider location/radiation/quality/duration/timing/severity/associated sxs/prior Treatment) Patient was involved in dog bite 03/10/16 and is receiving rabies vaccination.  He did receive TD. He has been picking at dressings.  He has several skin tears on his arms and hands.  He needs the dressings changed.   The history is provided by the patient.  Wound Check  This is a recurrent problem. The current episode started more than 2 days ago. The problem occurs daily. The problem has been gradually improving. Nothing aggravates the symptoms. Nothing relieves the symptoms. He has tried nothing for the symptoms.    Past Medical History:  Diagnosis Date  . Allergy   . Hematuria   . History of kidney stones   . Hyperlipidemia   . Hypertension   . Mild aortic stenosis   . Paroxysmal atrial fibrillation (HCC)    rate controlled on diltiazem and anticoagulated on Xarelto to  . Shortness of breath on exertion 01/15/2010   2D Echo - EF >55%; Nuclear Stress Test 01/14/2010 - EKG negative for ischemia, normal myocardial perfusion study   Past Surgical History:  Procedure Laterality Date  . APPENDECTOMY  02/07/2009   Dr Donne Hazel  . CATARACT EXTRACTION, BILATERAL    . CHOLECYSTECTOMY N/A 04/21/2013   Procedure: LAPAROSCOPIC CHOLECYSTECTOMY WITH INTRAOPERATIVE CHOLANGIOGRAM;  Surgeon: Adin Hector, MD;  Location: [facility];  Service: General;  Laterality: N/A;  . CYSTOSCOPY WITH URETEROSCOPY AND STENT PLACEMENT Right 10/05/2013   Procedure: CYSTOSCOPY WITH RIGHT URETEROSCOPY AND STENT PLACEMENT;  Surgeon: Ardis Hughs, MD;  Location: [facility];  Service: Urology;  Laterality: Right;  . ERCP N/A 04/20/2013   Procedure: ENDOSCOPIC RETROGRADE CHOLANGIOPANCREATOGRAPHY (ERCP);  Surgeon: Beryle Beams, MD;  Location: [facility];  Service: Endoscopy;  Laterality: N/A;  . HOLMIUM LASER APPLICATION Right  [id number]   Procedure: RIGHT LASER LITHOTRIPSY;  Surgeon: Ardis Hughs, MD;  Location: [facility];  Service: Urology;  Laterality: Right;  . TONSILLECTOMY    . TRANSURETHRAL RESECTION OF BLADDER TUMOR N/A 10/05/2013   Procedure: TRANSURETHRAL RESECTION OF BLADDER TUMOR (TURBT);  Surgeon: Ardis Hughs, MD;  Location: [facility];  Service: Urology;  Laterality: N/A;   Family History  Problem Relation Age of Onset  . Lung disease Father    Social History  Substance Use Topics  . Smoking status: Former Smoker    Quit date: 04/04/1969  . Smokeless tobacco: Former Systems developer    Quit date: 04/04/1969  . Alcohol use Yes     Comment: occasional - wine    Review of Systems  Constitutional: Negative.   HENT: Negative.   Eyes: Negative.   Respiratory: Negative.   Cardiovascular: Negative.   Gastrointestinal: Negative.   Endocrine: Negative.   Genitourinary: Negative.   Musculoskeletal: Negative.   Skin: Positive for wound.  Allergic/Immunologic: Negative.   Neurological: Negative.   Hematological: Negative.   Psychiatric/Behavioral: Negative.     Allergies  Review of patient's allergies indicates no known allergies.  Home Medications   Prior to Admission medications   Medication Sig Start Date End Date Taking? Authorizing Provider  acetaminophen-codeine (TYLENOL #3) 300-30 MG per tablet Take 1 tablet by mouth every 4 (four) hours as needed. 10/05/13   Ardis Hughs, MD  amoxicillin-clavulanate (AUGMENTIN) 875-125 MG tablet Take 1 tablet by mouth every 12 (twelve) hours. 03/10/16   Courteney Lyn Mackuen, MD  atorvastatin (  LIPITOR) 20 MG tablet Take 20 mg by mouth daily.    Historical Provider, MD  Biotin 300 MCG TABS Take 500 mcg by mouth daily.    Historical Provider, MD  cholecalciferol (VITAMIN D) 1000 UNITS tablet Take 1,000 Units by mouth daily.    Historical Provider, MD  doxazosin (CARDURA) 4 MG tablet Take 0.5 tablets (2 mg total) by mouth at bedtime. 04/24/13   Nita Sells, MD  fish oil-omega-3 fatty acids 1000 MG capsule Take 1 g by mouth daily.    Historical Provider, MD  flunisolide (NASALIDE) 25 MCG/ACT (0.025%) SOLN Inhale 2 sprays into the lungs 2 (two) times daily.    Historical Provider, MD  furosemide (LASIX) 40 MG tablet Take 40 mg by mouth daily. Take an additional 40mg  if weight increase by 3 lbs in one day or 5 lbs in one week 06/04/14   Historical Provider, MD  Ipratropium-Albuterol (COMBIVENT RESPIMAT) 20-100 MCG/ACT AERS respimat Inhale 1 puff into the lungs every 6 (six) hours as needed for wheezing.     Historical Provider, MD  metoprolol succinate (TOPROL XL) 25 MG 24 hr tablet Take 1 tablet (25 mg total) by mouth daily. 10/03/13   Lorretta Harp, MD  Multiple Vitamin (MULTIVITAMIN WITH MINERALS) TABS tablet Take 1 tablet by mouth daily.    Historical Provider, MD  omeprazole (PRILOSEC) 20 MG capsule Take 20 mg by mouth daily.    Historical Provider, MD  oxyCODONE-acetaminophen (PERCOCET/ROXICET) 5-325 MG tablet Take 1 tablet by mouth every 6 (six) hours as needed for severe pain. 03/10/16   Courteney Lyn Mackuen, MD  oxyCODONE-acetaminophen (PERCOCET/ROXICET) 5-325 MG tablet Take 1-2 tablets by mouth every 4 (four) hours as needed for severe pain. 03/15/16   Lysbeth Penner, FNP  Rivaroxaban (XARELTO) 15 MG TABS tablet Take 1 tablet (15 mg total) by mouth daily with supper. 04/02/15   Lorretta Harp, MD  vitamin C (ASCORBIC ACID) 500 MG tablet Take 500 mg by mouth daily.    Historical Provider, MD   Meds Ordered and Administered this Visit  Medications - No data to display  BP 129/75 (BP Location: Left Arm)   Pulse 60   Temp 98.3 F (36.8 C) (Oral)   Resp 12   SpO2 99%  No data found.   Physical Exam  Constitutional: He appears well-developed and well-nourished.  HENT:  Head: Normocephalic and atraumatic.  Eyes: Conjunctivae and EOM are normal. Pupils are equal, round, and reactive to light.  Cardiovascular: Normal rate, regular  rhythm and normal heart sounds.   Pulmonary/Chest: Effort normal and breath sounds normal.  Neurological: He is alert.  Skin:  Skin tear right hand and right arm.  Skin tears left hand and left arm.  Nursing note and vitals reviewed.   Urgent Care Course   Clinical Course    Procedures (including critical care time)  Labs Review Labs Reviewed - No data to display  Imaging Review No results found.   Visual Acuity Review  Right Eye Distance:   Left Eye Distance:   Bilateral Distance:    Right Eye Near:   Left Eye Near:    Bilateral Near:         MDM   1. Multiple skin tears   2. Dog bite   3. Pain of upper extremity, unspecified laterality    Betadine dressings to bilateral arms with Kerlix and recommend daily dressing changes.  Oxycodone 5/325 one to two po q 6 hours prn #15  Lysbeth Penner, FNP 03/15/16 1750

## 2016-03-17 ENCOUNTER — Encounter (HOSPITAL_COMMUNITY): Payer: Self-pay | Admitting: *Deleted

## 2016-03-17 ENCOUNTER — Ambulatory Visit (HOSPITAL_COMMUNITY)
Admission: EM | Admit: 2016-03-17 | Discharge: 2016-03-17 | Disposition: A | Discharge status: Home / Self Care | Payer: Medicare Other | Attending: Family Medicine | Admitting: Family Medicine

## 2016-03-17 DIAGNOSIS — Z2914 Encounter for prophylactic rabies immune globin: Secondary | ICD-10-CM | POA: Diagnosis not present

## 2016-03-17 DIAGNOSIS — Z203 Contact with and (suspected) exposure to rabies: Secondary | ICD-10-CM

## 2016-03-17 MED ORDER — RABIES VACCINE, PCEC IM SUSR
1.0000 mL | Freq: Once | INTRAMUSCULAR | Status: AC
  Administered 2016-03-17: 1 mL via INTRAMUSCULAR

## 2016-03-17 MED ORDER — RABIES VACCINE, PCEC IM SUSR
INTRAMUSCULAR | Status: AC
  Filled 2016-03-17: qty 1

## 2016-03-17 NOTE — Discharge Instructions (Signed)
followup as  Directed  For  Next injection  Follow  Wound  Care  Instructions  Return   As   Directed

## 2016-03-24 ENCOUNTER — Encounter (HOSPITAL_COMMUNITY): Payer: Self-pay | Admitting: Emergency Medicine

## 2016-03-24 ENCOUNTER — Ambulatory Visit (HOSPITAL_COMMUNITY)
Admission: EM | Admit: 2016-03-24 | Discharge: 2016-03-24 | Disposition: A | Discharge status: Home / Self Care | Payer: Medicare Other | Attending: Family Medicine | Admitting: Family Medicine

## 2016-03-24 DIAGNOSIS — Z2914 Encounter for prophylactic rabies immune globin: Secondary | ICD-10-CM | POA: Diagnosis not present

## 2016-03-24 DIAGNOSIS — Z203 Contact with and (suspected) exposure to rabies: Secondary | ICD-10-CM | POA: Diagnosis not present

## 2016-03-24 MED ORDER — RABIES VACCINE, PCEC IM SUSR
INTRAMUSCULAR | Status: AC
  Filled 2016-03-24: qty 1

## 2016-03-24 MED ORDER — RABIES VACCINE, PCEC IM SUSR
1.0000 mL | Freq: Once | INTRAMUSCULAR | Status: AC
  Administered 2016-03-24: 1 mL via INTRAMUSCULAR

## 2016-03-24 NOTE — ED Triage Notes (Signed)
Here for last vaccination

## 2018-03-09 IMAGING — CR DG FOREARM 2V*R*
2 series · 2 of 2 positions shown · non-contrast
Comparison: None.

CLINICAL DATA: Recently attacked by dogs with forearm pain, initial
encounter

EXAM:
RIGHT FOREARM - 2 VIEW

[forearm ap]
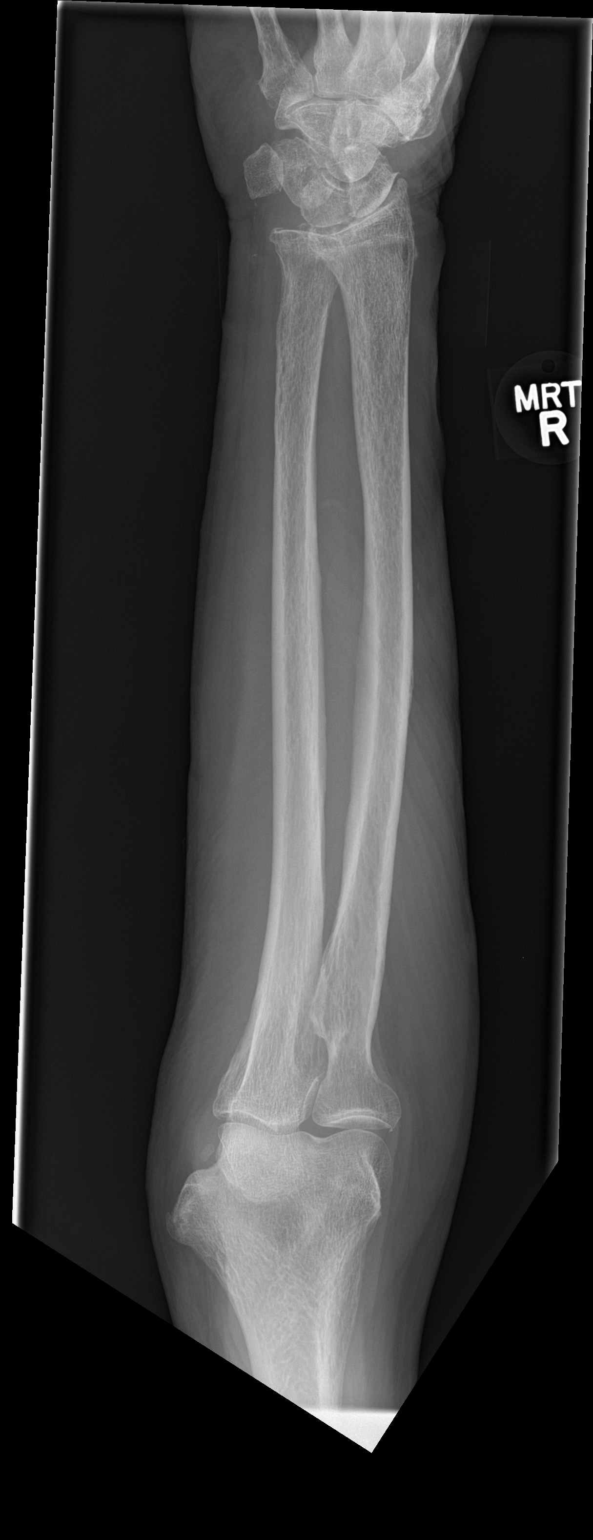

[forearm lat]
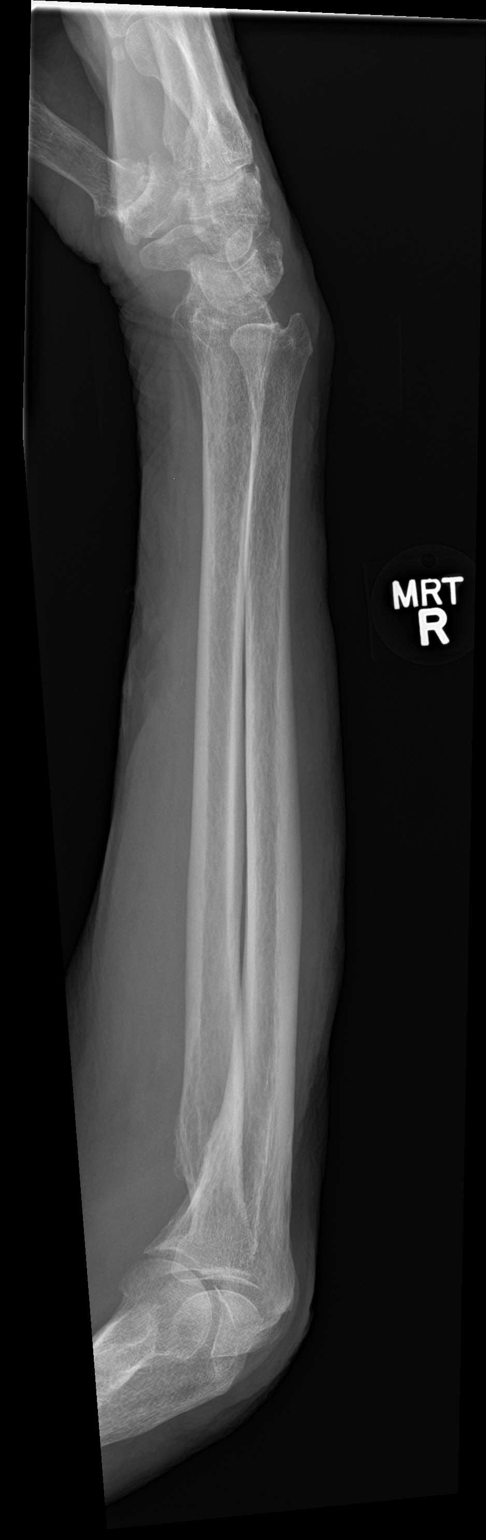

[2 of 2 positions shown; findings below may reference images not displayed]

FINDINGS: No acute fracture or dislocation is noted. Mild degenerative change
in the radiocarpal joint is seen. No gross soft tissue abnormality
is noted.
IMPRESSION: No acute abnormality seen.

## 2018-03-09 IMAGING — CR DG HAND COMPLETE 3+V*R*
3 series · 3 of 3 positions shown · non-contrast
Comparison: None.

CLINICAL DATA: Recently attacked by dog with hand pain, initial
encounter

EXAM:
RIGHT HAND - COMPLETE 3+ VIEW

[hand pa]
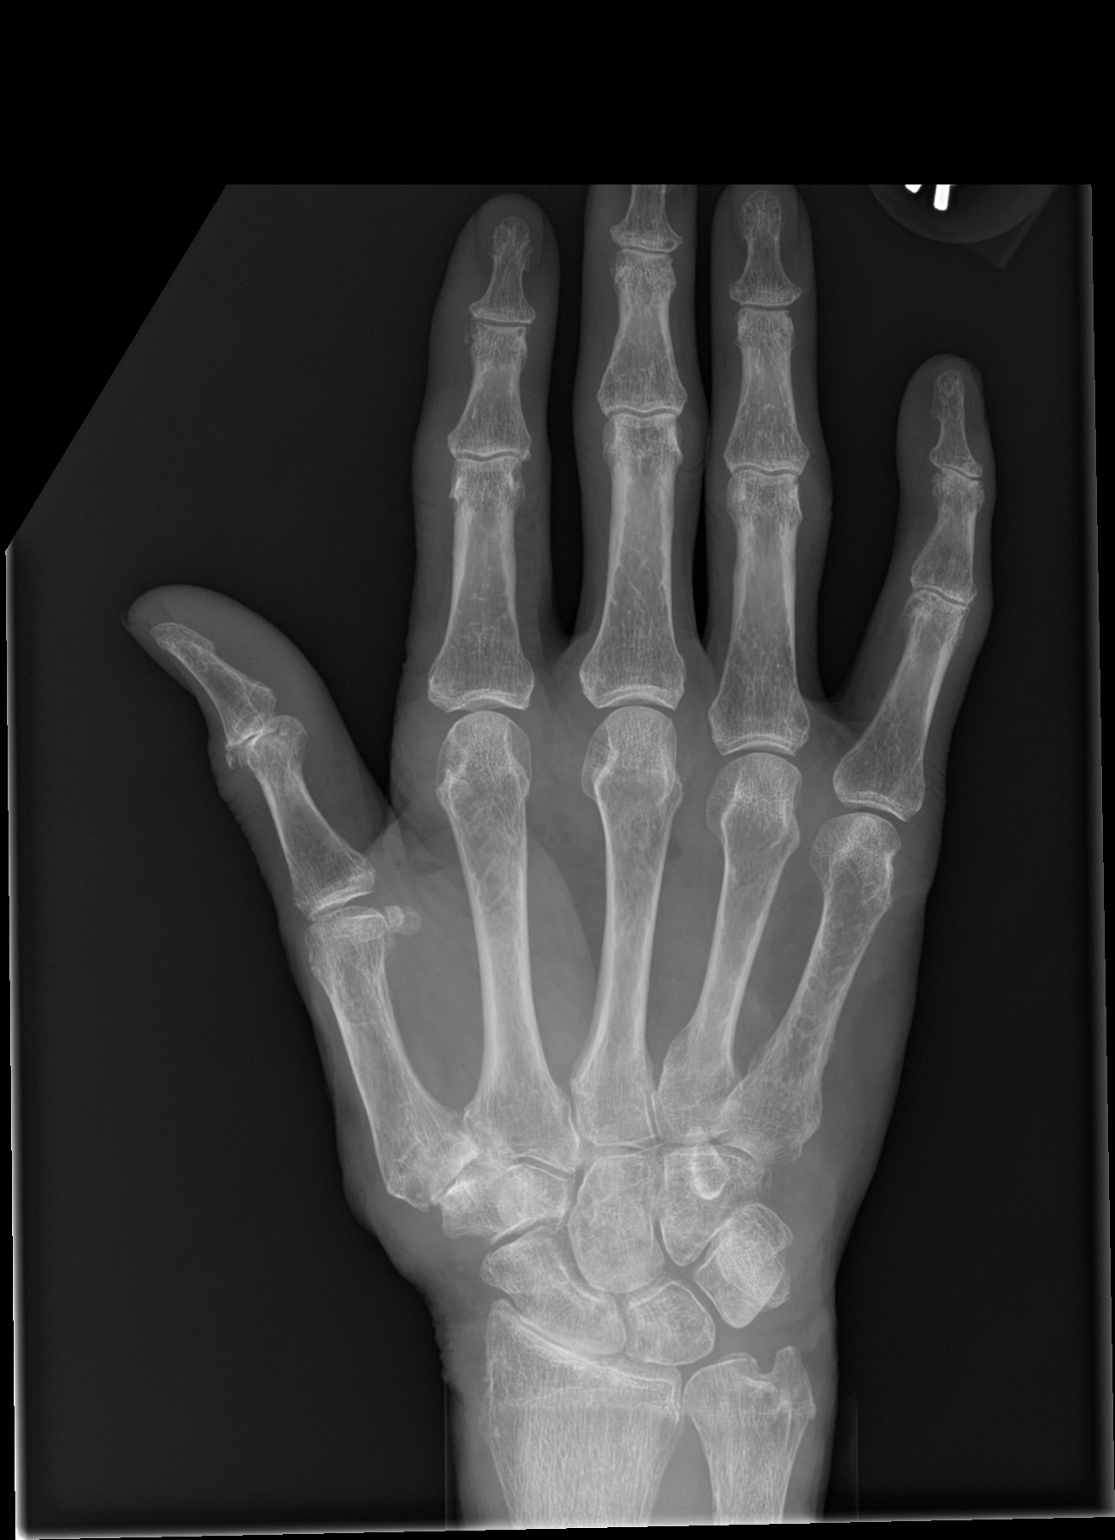

[hand obl]
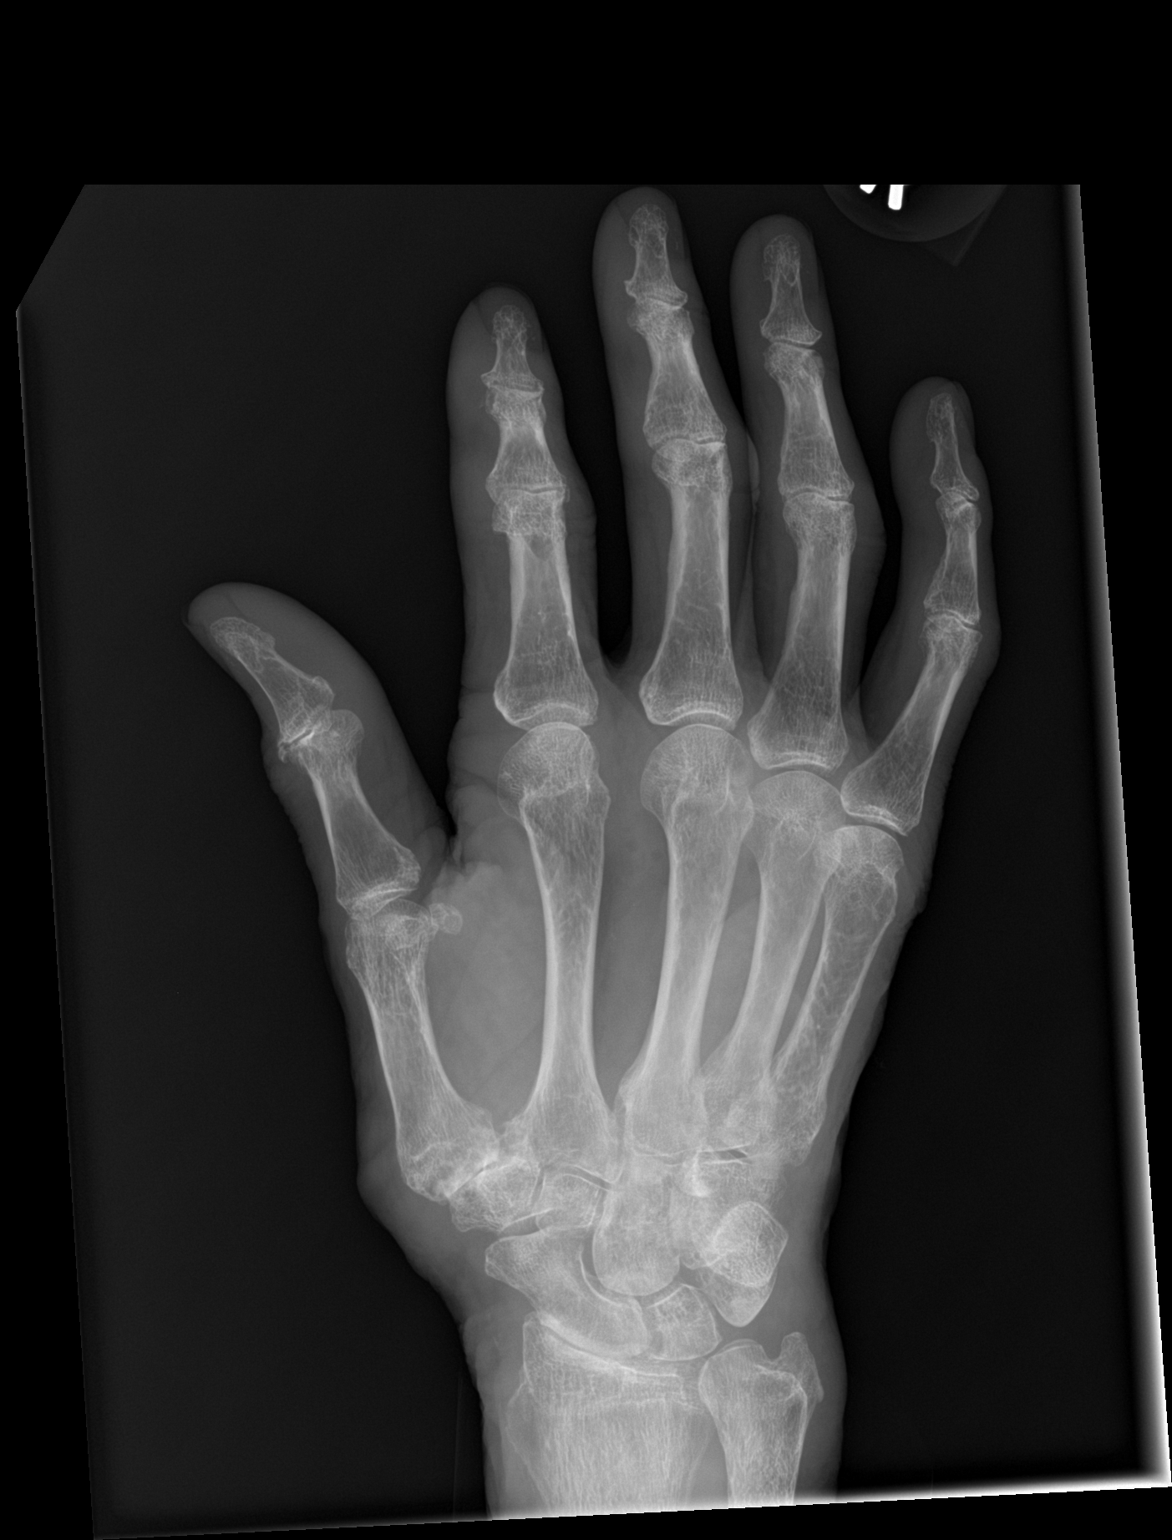

[hand lat]
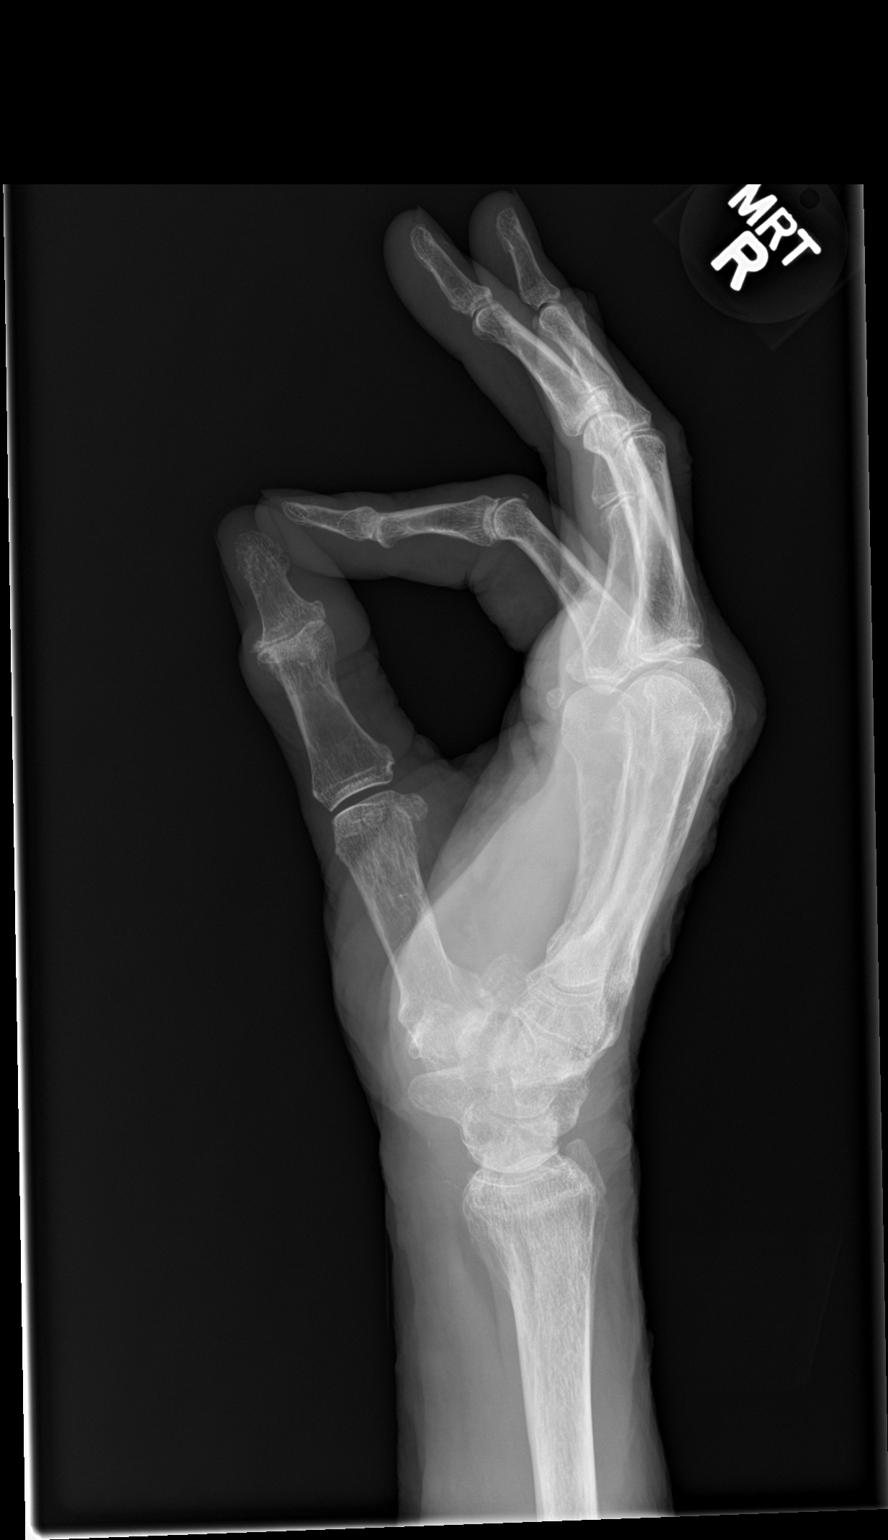

[3 of 3 positions shown; findings below may reference images not displayed]

FINDINGS: Mild degenerative changes are noted at the first CMC joint, the
radiocarpal joint and the interphalangeal joints. No acute fracture
or dislocation is noted. No significant soft tissue abnormality is
noted.
IMPRESSION: Degenerative changes without acute abnormality.

## 2018-05-02 DEATH — deceased
